# Patient Record
Sex: Male | Born: 1960 | Race: Black or African American | Hispanic: No | Marital: Married | Smoking: Never smoker
Health system: Southern US, Community
[De-identification: ages and names within clinical notes are randomized; demographics above are authoritative.]

## PROBLEM LIST (undated history)

## (undated) DIAGNOSIS — J45909 Unspecified asthma, uncomplicated: Secondary | ICD-10-CM

## (undated) DIAGNOSIS — E785 Hyperlipidemia, unspecified: Secondary | ICD-10-CM

## (undated) DIAGNOSIS — F329 Major depressive disorder, single episode, unspecified: Secondary | ICD-10-CM

## (undated) DIAGNOSIS — I10 Essential (primary) hypertension: Secondary | ICD-10-CM

## (undated) DIAGNOSIS — M109 Gout, unspecified: Secondary | ICD-10-CM

## (undated) DIAGNOSIS — I82409 Acute embolism and thrombosis of unspecified deep veins of unspecified lower extremity: Secondary | ICD-10-CM

## (undated) DIAGNOSIS — G4730 Sleep apnea, unspecified: Secondary | ICD-10-CM

## (undated) DIAGNOSIS — F32A Depression, unspecified: Secondary | ICD-10-CM

## (undated) DIAGNOSIS — C009 Malignant neoplasm of lip, unspecified: Secondary | ICD-10-CM

## (undated) DIAGNOSIS — R319 Hematuria, unspecified: Secondary | ICD-10-CM

## (undated) DIAGNOSIS — F419 Anxiety disorder, unspecified: Secondary | ICD-10-CM

## (undated) DIAGNOSIS — I872 Venous insufficiency (chronic) (peripheral): Secondary | ICD-10-CM

## (undated) HISTORY — PX: OTHER SURGICAL HISTORY: SHX169

## (undated) HISTORY — DX: Depression, unspecified: F32.A

## (undated) HISTORY — PX: EYE SURGERY: SHX253

## (undated) HISTORY — DX: Hyperlipidemia, unspecified: E78.5

## (undated) HISTORY — DX: Acute embolism and thrombosis of unspecified deep veins of unspecified lower extremity: I82.409

## (undated) HISTORY — DX: Malignant neoplasm of lip, unspecified: C00.9

## (undated) HISTORY — DX: Anxiety disorder, unspecified: F41.9

## (undated) HISTORY — DX: Sleep apnea, unspecified: G47.30

## (undated) HISTORY — DX: Major depressive disorder, single episode, unspecified: F32.9

---

## 2004-06-08 ENCOUNTER — Emergency Department: Payer: Self-pay | Admitting: General Practice

## 2004-06-09 ENCOUNTER — Ambulatory Visit: Payer: Self-pay | Admitting: Internal Medicine

## 2005-06-26 ENCOUNTER — Emergency Department: Payer: Self-pay | Admitting: General Practice

## 2005-07-08 ENCOUNTER — Emergency Department: Payer: Self-pay | Admitting: Emergency Medicine

## 2005-07-14 ENCOUNTER — Emergency Department: Payer: Self-pay | Admitting: Emergency Medicine

## 2005-07-18 ENCOUNTER — Emergency Department: Payer: Self-pay | Admitting: Unknown Physician Specialty

## 2005-07-28 ENCOUNTER — Emergency Department: Payer: Self-pay | Admitting: Emergency Medicine

## 2005-08-11 ENCOUNTER — Emergency Department: Payer: Self-pay | Admitting: Emergency Medicine

## 2005-08-18 ENCOUNTER — Emergency Department: Payer: Self-pay | Admitting: Emergency Medicine

## 2005-09-16 ENCOUNTER — Emergency Department: Payer: Self-pay | Admitting: Emergency Medicine

## 2005-09-29 ENCOUNTER — Emergency Department: Payer: Self-pay | Admitting: Unknown Physician Specialty

## 2005-11-13 ENCOUNTER — Emergency Department: Payer: Self-pay | Admitting: General Practice

## 2005-12-16 ENCOUNTER — Emergency Department: Payer: Self-pay | Admitting: Internal Medicine

## 2006-01-05 ENCOUNTER — Emergency Department: Payer: Self-pay | Admitting: General Practice

## 2006-02-01 ENCOUNTER — Emergency Department: Payer: Self-pay | Admitting: Unknown Physician Specialty

## 2006-02-07 ENCOUNTER — Emergency Department: Payer: Self-pay | Admitting: Unknown Physician Specialty

## 2006-02-16 ENCOUNTER — Emergency Department: Payer: Self-pay | Admitting: Unknown Physician Specialty

## 2006-02-28 ENCOUNTER — Emergency Department: Payer: Self-pay | Admitting: Emergency Medicine

## 2006-03-03 ENCOUNTER — Emergency Department: Payer: Self-pay | Admitting: Emergency Medicine

## 2006-03-07 ENCOUNTER — Emergency Department: Payer: Self-pay | Admitting: Emergency Medicine

## 2006-03-15 ENCOUNTER — Emergency Department: Payer: Self-pay | Admitting: Unknown Physician Specialty

## 2006-03-22 ENCOUNTER — Emergency Department: Payer: Self-pay | Admitting: Unknown Physician Specialty

## 2006-03-30 ENCOUNTER — Emergency Department: Payer: Self-pay | Admitting: Emergency Medicine

## 2006-04-06 ENCOUNTER — Emergency Department: Payer: Self-pay | Admitting: Internal Medicine

## 2006-04-14 ENCOUNTER — Emergency Department: Payer: Self-pay | Admitting: Unknown Physician Specialty

## 2006-04-22 ENCOUNTER — Emergency Department: Payer: Self-pay | Admitting: Emergency Medicine

## 2006-08-27 ENCOUNTER — Emergency Department: Payer: Self-pay | Admitting: Emergency Medicine

## 2006-09-29 ENCOUNTER — Emergency Department: Payer: Self-pay | Admitting: Emergency Medicine

## 2007-10-19 ENCOUNTER — Ambulatory Visit: Payer: Self-pay | Admitting: *Deleted

## 2007-10-31 ENCOUNTER — Encounter: Admission: RE | Admit: 2007-10-31 | Discharge: 2008-01-29 | Payer: Self-pay

## 2007-12-13 ENCOUNTER — Ambulatory Visit: Payer: Self-pay | Admitting: Internal Medicine

## 2007-12-13 ENCOUNTER — Encounter: Payer: Self-pay | Admitting: Family Medicine

## 2007-12-13 LAB — CONVERTED CEMR LAB
AST: 17 units/L (ref 0–37)
Alkaline Phosphatase: 71 units/L (ref 39–117)
BUN: 11 mg/dL (ref 6–23)
Basophils Relative: 1 % (ref 0–1)
Creatinine, Ser: 0.76 mg/dL (ref 0.40–1.50)
Eosinophils Absolute: 0.3 10*3/uL (ref 0.0–0.7)
Eosinophils Relative: 3 % (ref 0–5)
HCT: 44.2 % (ref 39.0–52.0)
HDL: 53 mg/dL (ref >39)
Hemoglobin: 14.8 g/dL (ref 13.0–17.0)
LDL Cholesterol: 88 mg/dL (ref 0–99)
MCHC: 33.5 g/dL (ref 30.0–36.0)
MCV: 91.1 fL (ref 78.0–100.0)
Monocytes Absolute: 0.5 10*3/uL (ref 0.1–1.0)
Monocytes Relative: 5 % (ref 3–12)
Neutrophils Relative %: 58 % (ref 43–77)
RBC: 4.85 M/uL (ref 4.22–5.81)
TSH: 4.991 microintl units/mL (ref 0.350–5.50)
Total Bilirubin: 0.4 mg/dL (ref 0.3–1.2)
Total CHOL/HDL Ratio: 3
Triglycerides: 94 mg/dL (ref <150)

## 2007-12-14 ENCOUNTER — Encounter: Payer: Self-pay | Admitting: Family Medicine

## 2008-01-12 ENCOUNTER — Ambulatory Visit: Payer: Self-pay | Admitting: Internal Medicine

## 2008-01-19 ENCOUNTER — Encounter: Admission: RE | Admit: 2008-01-19 | Discharge: 2008-01-19 | Payer: Self-pay

## 2008-02-13 ENCOUNTER — Encounter: Admission: RE | Admit: 2008-02-13 | Discharge: 2008-03-06 | Payer: Self-pay | Admitting: Family Medicine

## 2008-02-15 ENCOUNTER — Ambulatory Visit: Payer: Self-pay | Admitting: Internal Medicine

## 2008-02-15 ENCOUNTER — Encounter: Payer: Self-pay | Admitting: Family Medicine

## 2008-02-15 LAB — CONVERTED CEMR LAB
CO2: 23 meq/L (ref 19–32)
Calcium: 9.1 mg/dL (ref 8.4–10.5)
Chloride: 102 meq/L (ref 96–112)
Creatinine, Ser: 0.75 mg/dL (ref 0.40–1.50)
Glucose, Bld: 83 mg/dL (ref 70–99)
Total Bilirubin: 0.6 mg/dL (ref 0.3–1.2)
Total Protein: 7.8 g/dL (ref 6.0–8.3)

## 2008-02-16 ENCOUNTER — Encounter: Admission: RE | Admit: 2008-02-16 | Discharge: 2008-03-18 | Payer: Self-pay | Admitting: Student

## 2008-03-15 ENCOUNTER — Ambulatory Visit: Payer: Self-pay | Admitting: Internal Medicine

## 2008-03-21 ENCOUNTER — Encounter: Admission: RE | Admit: 2008-03-21 | Discharge: 2008-05-28 | Payer: Self-pay | Admitting: Student

## 2008-07-01 ENCOUNTER — Encounter: Admission: RE | Admit: 2008-07-01 | Discharge: 2008-07-16 | Payer: Self-pay | Admitting: Student

## 2008-07-03 ENCOUNTER — Ambulatory Visit: Payer: Self-pay | Admitting: Internal Medicine

## 2008-07-10 ENCOUNTER — Ambulatory Visit: Payer: Self-pay | Admitting: Internal Medicine

## 2008-07-22 ENCOUNTER — Ambulatory Visit: Payer: Self-pay | Admitting: Internal Medicine

## 2008-08-05 ENCOUNTER — Encounter: Admission: RE | Admit: 2008-08-05 | Discharge: 2008-11-03 | Payer: Self-pay | Admitting: Student

## 2008-08-06 ENCOUNTER — Ambulatory Visit: Payer: Self-pay | Admitting: Internal Medicine

## 2008-08-28 ENCOUNTER — Ambulatory Visit: Payer: Self-pay | Admitting: Internal Medicine

## 2008-09-18 ENCOUNTER — Ambulatory Visit: Payer: Self-pay | Admitting: Internal Medicine

## 2008-10-02 ENCOUNTER — Ambulatory Visit: Payer: Self-pay | Admitting: Internal Medicine

## 2008-11-20 ENCOUNTER — Encounter: Admission: RE | Admit: 2008-11-20 | Discharge: 2008-11-20 | Payer: Self-pay | Admitting: Student

## 2008-12-23 ENCOUNTER — Encounter: Admission: RE | Admit: 2008-12-23 | Discharge: 2009-03-23 | Payer: Self-pay | Admitting: Student

## 2009-01-01 ENCOUNTER — Ambulatory Visit: Payer: Self-pay | Admitting: Internal Medicine

## 2009-01-08 ENCOUNTER — Encounter: Payer: Self-pay | Admitting: Internal Medicine

## 2009-01-08 ENCOUNTER — Ambulatory Visit (HOSPITAL_COMMUNITY): Admission: RE | Admit: 2009-01-08 | Discharge: 2009-01-08 | Payer: Self-pay | Admitting: Internal Medicine

## 2009-01-08 ENCOUNTER — Ambulatory Visit: Payer: Self-pay | Admitting: Vascular Surgery

## 2009-06-25 ENCOUNTER — Ambulatory Visit: Payer: Self-pay | Admitting: Family Medicine

## 2009-07-15 ENCOUNTER — Encounter: Payer: Self-pay | Admitting: Family Medicine

## 2009-07-18 ENCOUNTER — Ambulatory Visit: Payer: Self-pay | Admitting: Family Medicine

## 2009-08-06 ENCOUNTER — Ambulatory Visit: Payer: Self-pay | Admitting: Family Medicine

## 2009-09-26 ENCOUNTER — Ambulatory Visit: Payer: Self-pay | Admitting: Family Medicine

## 2009-10-07 ENCOUNTER — Ambulatory Visit: Payer: Self-pay | Admitting: Family Medicine

## 2009-11-04 ENCOUNTER — Ambulatory Visit: Payer: Self-pay | Admitting: Family Medicine

## 2009-12-05 ENCOUNTER — Ambulatory Visit: Payer: Self-pay | Admitting: Family Medicine

## 2010-01-04 ENCOUNTER — Ambulatory Visit: Payer: Self-pay | Admitting: Family Medicine

## 2010-02-13 ENCOUNTER — Ambulatory Visit: Payer: Self-pay | Admitting: Family Medicine

## 2010-03-17 ENCOUNTER — Ambulatory Visit: Payer: Self-pay | Admitting: Family Medicine

## 2010-04-02 ENCOUNTER — Ambulatory Visit: Payer: Self-pay | Admitting: Family Medicine

## 2010-04-06 ENCOUNTER — Ambulatory Visit: Payer: Self-pay | Admitting: Family Medicine

## 2010-05-08 ENCOUNTER — Ambulatory Visit: Payer: Self-pay | Admitting: Family Medicine

## 2010-06-06 ENCOUNTER — Ambulatory Visit: Payer: Self-pay | Admitting: Family Medicine

## 2011-07-22 ENCOUNTER — Ambulatory Visit: Payer: Self-pay | Admitting: Family Medicine

## 2011-08-07 ENCOUNTER — Ambulatory Visit: Payer: Self-pay | Admitting: Family Medicine

## 2011-11-25 ENCOUNTER — Ambulatory Visit: Payer: Self-pay | Admitting: Family Medicine

## 2011-12-06 ENCOUNTER — Ambulatory Visit: Payer: Self-pay | Admitting: Family Medicine

## 2012-01-06 ENCOUNTER — Ambulatory Visit: Payer: Self-pay | Admitting: Family Medicine

## 2012-02-05 ENCOUNTER — Ambulatory Visit: Payer: Self-pay | Admitting: Family Medicine

## 2012-03-06 ENCOUNTER — Ambulatory Visit: Payer: Self-pay | Admitting: Family Medicine

## 2012-04-20 ENCOUNTER — Ambulatory Visit: Payer: Self-pay | Admitting: Family Medicine

## 2012-05-07 ENCOUNTER — Ambulatory Visit: Payer: Self-pay | Admitting: Family Medicine

## 2014-04-08 ENCOUNTER — Ambulatory Visit: Payer: Self-pay | Admitting: Family Medicine

## 2014-05-07 ENCOUNTER — Ambulatory Visit: Payer: Self-pay | Admitting: Family Medicine

## 2014-06-10 ENCOUNTER — Ambulatory Visit: Payer: Self-pay | Admitting: Family Medicine

## 2014-07-07 ENCOUNTER — Ambulatory Visit: Payer: Self-pay | Admitting: Family Medicine

## 2014-08-20 ENCOUNTER — Ambulatory Visit: Payer: Self-pay | Admitting: Family Medicine

## 2014-09-06 ENCOUNTER — Ambulatory Visit: Payer: Self-pay | Admitting: Family Medicine

## 2014-10-08 ENCOUNTER — Ambulatory Visit: Payer: Self-pay | Admitting: Family Medicine

## 2015-11-07 ENCOUNTER — Other Ambulatory Visit: Payer: Self-pay | Admitting: General Surgery

## 2015-11-25 ENCOUNTER — Ambulatory Visit (HOSPITAL_COMMUNITY)
Admission: RE | Admit: 2015-11-25 | Discharge: 2015-11-25 | Disposition: A | Discharge status: Home / Self Care | Payer: Medicare Other | Source: Ambulatory Visit | Attending: General Surgery | Admitting: General Surgery

## 2015-11-25 ENCOUNTER — Other Ambulatory Visit: Payer: Self-pay | Admitting: General Surgery

## 2015-11-25 DIAGNOSIS — I517 Cardiomegaly: Secondary | ICD-10-CM | POA: Insufficient documentation

## 2015-11-25 DIAGNOSIS — J810 Acute pulmonary edema: Secondary | ICD-10-CM | POA: Diagnosis not present

## 2015-12-09 ENCOUNTER — Ambulatory Visit: Payer: Self-pay | Admitting: Dietician

## 2015-12-16 ENCOUNTER — Encounter: Payer: Self-pay | Admitting: Dietician

## 2015-12-16 ENCOUNTER — Encounter: Payer: Medicare Other | Attending: General Surgery | Admitting: Dietician

## 2015-12-16 NOTE — Progress Notes (Signed)
  Pre-Op Assessment Visit:  Pre-Operative Sleeve Gastrectomy Surgery  Medical Nutrition Therapy:  Appt start time: [id number]   End time:  1220.  Patient was seen on 12/16/2015 for Pre-Operative Nutrition Assessment. Assessment and letter of approval faxed to [facility] Bariatric Surgery Program coordinator on 12/16/2015.   Preferred Learning Style:   No preference indicated   Learning Readiness:   Ready  Handouts given during visit include:  Pre-Op Goals Bariatric Surgery Protein Shakes   During the appointment today the following Pre-Op Goals were reviewed with the patient: Maintain or lose weight as instructed by your surgeon Make healthy food choices Begin to limit portion sizes Limited concentrated sugars and fried foods Keep fat/sugar in the single digits per serving on   food labels Practice CHEWING your food  (aim for 30 chews per bite or until applesauce consistency) Practice not drinking 15 minutes before, during, and 30 minutes after each meal/snack Avoid all carbonated beverages  Avoid/limit caffeinated beverages  Avoid all sugar-sweetened beverages Consume 3 meals per day; eat every 3-5 hours Make a list of non-food related activities Aim for 64-100 ounces of FLUID daily  Aim for at least 60-80 grams of PROTEIN daily Look for a liquid protein source that contain ?15 g protein and ?5 g carbohydrate  (ex: shakes, drinks, shots)  Patient-Centered Goals: Play basketball, walk more  Demonstrated degree of understanding via:  Teach Back  Teaching Method Utilized:  Visual Auditory Hands on  Barriers to learning/adherence to lifestyle change: intellectually disabled but high functioning  [facility] to [facility] to enroll in Pre-Op and Post-Op Nutrition Education when surgery date is scheduled.

## 2015-12-16 NOTE — Patient Instructions (Signed)
Follow Pre-Op Goals Try Protein Shakes Call [facility] at [phone] when surgery is scheduled to enroll in Pre-Op Class  Things to remember:  Please always be honest with us. We want to support you!  If you have any questions or concerns in between appointments, please call or email Liz, Leslie, or Laurie.  The diet after surgery will be high protein and low in carbohydrate.  Vitamins and calcium need to be taken for the rest of your life.  Feel free to include support people in any classes or appointments.   Supplement recommendations:  Complete" Multivitamin: Sleeve Gastrectomy and RYGB patients take a double dose of MVI. LAGB patients take single dose as it is written on the package. Vitamin must be liquid or chewable but not gummy. Examples of these include Flintstones Complete and Centrum Complete. If the vitamin is bariatric-specific, take 1 dose as it is already formulated for bariatric surgery patients. Examples of these are Bariatric Advantage, Celebrate, and Wellesse. These can be found at the [facility] and/or online.     Calcium citrate: 1500 mg/day of Calcium citrate (also chewable or liquid) is recommended for all procedures. The body is only able to absorb 500-600 mg of Calcium at one time so 3 daily doses of 500 mg are recommended. Calcium doses must be taken a minimum of 2 hours apart. Additionally, Calcium must be taken 2 hours apart from iron-containing MVI. Examples of brands include Celebrate, Bariatric Advantage, and Wellesse. These brands must be purchased online or at the  Outpatient Pharmacy. Citracal Petites is the only Calcium citrate supplement found in general grocery stores and pharmacies. This is in tablet form and may be recommended for patients who do not tolerate chewable Calcium.  Continued or added Vitamin D supplementation based on individual needs.    Vitamin B12: 300-500 mcg/day for Sleeve Gastrectomy and RYGB. Optional for  LAGB patients as stomach remains fully intact. Must be taken intramuscularly, sublingually, or inhaled nasally. Oral route is not recommended. 

## 2016-01-27 ENCOUNTER — Encounter: Payer: Self-pay | Admitting: Dietician

## 2016-01-27 ENCOUNTER — Encounter: Payer: Medicare Other | Attending: General Surgery | Admitting: Dietician

## 2016-01-27 NOTE — Progress Notes (Signed)
6 Months Supervised Weight Loss Visit:   Pre-Operative sleeve gastrectomy Surgery  Medical Nutrition Therapy:  Appt start time: [id number] end time:  1130.  Primary concerns today: Supervised Weight Loss Visit. At appointment with wife and caregiver. Returns with a 3 lb weight loss since last time. Has been cutting back soda. Drinking water with flavoring and 2% milk. Having 3 meals per day. Having some sweets or fried foods (not often). Walks 3 x week for 15-20 minutes and swims 2 x week for about 60 minutes. Has tried [employer] and and Aktins. Still needs to work on chewing well. Waiting to drink until finished meal.   Works Wednesday and Friday. Feels like he has more energy and can tell he lost weight.   Weight: 479.1 lbs BMI: 66.8  Preferred Learning Style:   Auditory  No preference indicated   Learning Readiness:   Ready  Progress Towards Goal(s):  In progress.  Handouts given during visit include:  High protein snacks   Nutritional Diagnosis:  [place]-3.3 Obesity related to past poor dietary habits and physical inactivity as evidenced by patient attending supervised weight loss for insurance approval of bariatric surgery.    Intervention:  Nutrition counseling provided. Plan: Make sure the Slim Fast has 5 grams of carbohydrates or less per serving and 15 grams of protein or more per serving. (Low carb version). Work on cutting back on portion sizes.  Cut out soda completely. Work on chewing well and taking 20 minutes to eat. Start working on waiting 30 minutes to drink after eating.  Try 1% milk.   Teaching Method Utilized:  Visual Auditory Hands on  Barriers to learning/adherence to lifestyle change: slow learner  Demonstrated degree of understanding via:  Teach Back   Monitoring/Evaluation:  Dietary intake, exercise, and body weight. Follow up in 1 months for 6 month supervised weight loss visit.

## 2016-01-27 NOTE — Patient Instructions (Addendum)
Make sure the Slim Fast has 5 grams of carbohydrates or less per serving and 15 grams of protein or more per serving. (Low carb version). Work on cutting back on portion sizes.  Cut out soda completely. Work on chewing well and taking 20 minutes to eat. Start working on waiting 30 minutes to drink after eating.  Try 1% milk.

## 2016-02-24 ENCOUNTER — Encounter: Payer: Self-pay | Admitting: Dietician

## 2016-02-24 ENCOUNTER — Encounter: Payer: Medicare Other | Attending: General Surgery | Admitting: Dietician

## 2016-02-24 VITALS — Ht 71.0 in | Wt >= 6400 oz

## 2016-02-24 DIAGNOSIS — Z713 Dietary counseling and surveillance: Secondary | ICD-10-CM | POA: Diagnosis not present

## 2016-02-24 DIAGNOSIS — E119 Type 2 diabetes mellitus without complications: Secondary | ICD-10-CM | POA: Diagnosis not present

## 2016-02-24 NOTE — Patient Instructions (Signed)
Make sure the Slim Fast has 5 grams of carbohydrates or less per serving and 15 grams of protein or more per serving. (Low carb version). Work on cutting back on portion sizes.  Cut out soda completely. Work on chewing well and taking 20 minutes to eat. Start working on waiting 30 minutes to drink after eating.  Try 1% milk.   -Try to start getting into the mindset of not having any bread after surgery -Boil some eggs to have for snacks

## 2016-02-24 NOTE — Progress Notes (Signed)
6 Months Supervised Weight Loss Visit:   Pre-Operative sleeve gastrectomy Surgery  Medical Nutrition Therapy:  Appt start time: [id number] end time:  1130.  Primary concerns today: Supervised Weight Loss Visit. Eric Berg returns today with his wife and caregiver having lost another 6.3 lbs. He states that things have been going well. Currently has a UTI and is taking antibiotics. 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 He had been walking and going to the [facility] previously (swimming laps). Has been drinking the Slim Fast low carb. Still working on breaking soda habit.  Weight: 472.7 lbs BMI: 66.1  Preferred Learning Style:   Auditory  No preference indicated   Learning Readiness:   Ready  Progress Towards Goal(s):  In progress.  24-hour recall: B: Slim Fast low carb shake L: salami and cheese sandwich D: 2 pork chops S: sugar free pudding or Fiber One bar or yogurt    Handouts given during visit include:  High protein snacks   Nutritional Diagnosis:  [place]-3.3 Obesity related to past poor dietary habits and physical inactivity as evidenced by patient attending supervised weight loss for insurance approval of bariatric surgery.    Intervention:  Nutrition counseling provided. Plan: Make sure the Slim Fast has 5 grams of carbohydrates or less per serving and 15 grams of protein or more per serving. (Low carb version). Work on cutting back on portion sizes.  Cut out soda completely. Work on chewing well and taking 20 minutes to eat. Start working on waiting 30 minutes to drink after eating.  Try 1% milk.  -Try to start getting into the mindset of not having any bread after surgery -Boil some eggs to have for snacks  Teaching Method Utilized:  Visual Auditory Hands on  Barriers to learning/adherence to lifestyle change: suspected learning deficit  Demonstrated degree of understanding via:  Teach Back    Monitoring/Evaluation:  Dietary intake, exercise, and body weight. Follow up in 1 months for 6 month supervised weight loss visit.

## 2016-03-23 ENCOUNTER — Encounter: Payer: Medicare Other | Attending: General Surgery | Admitting: Dietician

## 2016-03-23 ENCOUNTER — Encounter: Payer: Self-pay | Admitting: Dietician

## 2016-03-23 DIAGNOSIS — E119 Type 2 diabetes mellitus without complications: Secondary | ICD-10-CM | POA: Diagnosis not present

## 2016-03-23 DIAGNOSIS — Z713 Dietary counseling and surveillance: Secondary | ICD-10-CM | POA: Insufficient documentation

## 2016-03-23 NOTE — Progress Notes (Signed)
6 Months Supervised Weight Loss Visit:   Pre-Operative sleeve gastrectomy Surgery  Medical Nutrition Therapy:  Appt start time: 1120 end time:  [id number].  Primary concerns today: Supervised Weight Loss Visit. Eric Berg returns today with his wife and caregiver having lost another 6.3 lbs. He states that things have been going well. Currently has a UTI and is taking antibiotics. 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 He had been walking and going to the [facility] previously (swimming laps). Has been drinking the Slim Fast low carb. Still working on breaking soda habit.  Has lost another 3.5 pounds. Still struggling to reduce soda. He is not drinking soda every day, may have soda at work. He drinks milk, water, and sugar free, non carbonated ICE water. Weighed 502 lbs in February 2017. He states that his surgeon would like him to lose 50 pounds prior to surgery. Continuing to try to stay active with walking and swimming.   Goal: 441 lbs  Weight: 469.2 lbs BMI: 65.6   Preferred Learning Style:   Auditory  No preference indicated   Learning Readiness:   Ready  Progress Towards Goal(s):  In progress.  24-hour recall: B: Slim Fast low carb shake L: salami and cheese sandwich D: 2 pork chops S: sugar free pudding or Fiber One bar or yogurt    Handouts given during visit include:  High protein snacks   Nutritional Diagnosis:  [place]-3.3 Obesity related to past poor dietary habits and physical inactivity as evidenced by patient attending supervised weight loss for insurance approval of bariatric surgery.    Intervention:  Nutrition counseling provided. Plan: Make sure the Slim Fast has 5 grams of carbohydrates or less per serving and 15 grams of protein or more per serving. (Low carb version). Work on cutting back on portion sizes.  Cut out soda completely. Work on chewing well and taking 20 minutes to eat. Start working on  waiting 30 minutes to drink after eating.  Try 1% milk.  -Try to start getting into the mindset of not having any bread after surgery -Boil some eggs to have for snacks -Bring an ICE water to work to have instead of soda -Try Oikos Triple Zero or Dannon Light and Fit Greek yogurt  -Try freezing them in a popsicle mold -Protein shakes:  -Eas AdvantEdge  -Atkins  -Premier  Teaching Method Utilized:  Visual Auditory Hands on  Barriers to learning/adherence to lifestyle change: suspected learning deficit  Demonstrated degree of understanding via:  Teach Back   Monitoring/Evaluation:  Dietary intake, exercise, and body weight. Follow up in 1 months for 6 month supervised weight loss visit.

## 2016-03-23 NOTE — Patient Instructions (Addendum)
Make sure the Slim Fast has 5 grams of carbohydrates or less per serving and 15 grams of protein or more per serving. (Low carb version). Work on cutting back on portion sizes.  Cut out soda completely. Work on chewing well and taking 20 minutes to eat. Start working on waiting 30 minutes to drink after eating.  Try 1% milk.   -Try to start getting into the mindset of not having any bread after surgery  -Boil some eggs to have for snacks  -Bring an ICE water to work to have instead of soda  -Try Oikos Triple Zero or Dannon Light and Fit Greek yogurt  -Try freezing them in a popsicle mold  -Protein shakes:  -Eric Berg

## 2016-04-06 DIAGNOSIS — I82409 Acute embolism and thrombosis of unspecified deep veins of unspecified lower extremity: Secondary | ICD-10-CM

## 2016-04-06 HISTORY — DX: Acute embolism and thrombosis of unspecified deep veins of unspecified lower extremity: I82.409

## 2016-04-27 ENCOUNTER — Encounter: Payer: Medicare Other | Attending: General Surgery | Admitting: Dietician

## 2016-04-27 ENCOUNTER — Encounter: Payer: Self-pay | Admitting: Dietician

## 2016-04-27 DIAGNOSIS — E119 Type 2 diabetes mellitus without complications: Secondary | ICD-10-CM | POA: Diagnosis not present

## 2016-04-27 DIAGNOSIS — Z713 Dietary counseling and surveillance: Secondary | ICD-10-CM | POA: Insufficient documentation

## 2016-04-27 NOTE — Progress Notes (Signed)
6 Months Supervised Weight Loss Visit:   Pre-Operative sleeve gastrectomy Surgery  Medical Nutrition Therapy:  Appt start time: 1125 end time:  [id number].  Primary concerns today: Supervised Weight Loss Visit. Eric Berg returns today with his caregiver having maintained his weight. His caregiver states his sodium intake has been higher lately and he has significant swelling in his right leg. Otherwise, he can tell he's lost weight because he is able to be more mobile (able to do housework, able to get off couch easier, able to use seatbelt more comfortably and without an extension). Has continued to limit bread intake. Has been sticking to plain water at work instead of soda. However, he does sometimes still drink soda. Tried Atkins protein shake and likes it. He tried 1% milk and does not like it.  Would like to try a shaker bottle and is interested in updated fast food guide when available.   Samples provided and patient instructed on proper use: Unjury protein powder (chocolate - qty 2) Lot#: [id number] Exp: 06/2017  Goal: 441 lbs  Weight: 469.2 lbs BMI: 65.6   Preferred Learning Style:   Auditory  No preference indicated   Learning Readiness:   Ready  Progress Towards Goal(s):  In progress.  24-hour recall: B: Slim Fast low carb shake L: salami and cheese sandwich D: 2 pork chops S: sugar free pudding or Fiber One bar or yogurt    Handouts given during visit include:  none   Nutritional Diagnosis:  [name]-3.3 Obesity related to past poor dietary habits and physical inactivity as evidenced by patient attending supervised weight loss for insurance approval of bariatric surgery.    Intervention:  Nutrition counseling provided. -Continue to drink water at work instead of soda -Ask for the lower sodium deli meat  -Have 2 slices of [place] and 2 slices of cheese -Practice getting into the habit of chewing each bite 20-30 times -Try mixing chocolate protein powder in milk (think of  this as a meal or snack)  Teaching Method Utilized:  Visual Auditory Hands on  Barriers to learning/adherence to lifestyle change: suspected learning deficit  Demonstrated degree of understanding via:  Teach Back   Monitoring/Evaluation:  Dietary intake, exercise, and body weight. Follow up in 1 months for 6 month supervised weight loss visit.

## 2016-04-27 NOTE — Patient Instructions (Addendum)
Make sure the Slim Fast has 5 grams of carbohydrates or less per serving and 15 grams of protein or more per serving. (Low carb version). Work on cutting back on portion sizes.  Cut out soda completely. Work on chewing well and taking 20 minutes to eat. Start working on waiting 30 minutes to drink after eating.  Try 1% milk.   -Continue to drink water at work instead of soda -Ask for the lower sodium deli meat  -Have 2 slices of [place] and 2 slices of cheese -Practice getting into the habit of chewing each bite 20-30 times -Try mixing chocolate protein powder in milk (think of this as a meal or snack)

## 2016-04-30 ENCOUNTER — Other Ambulatory Visit: Payer: Self-pay | Admitting: Family Medicine

## 2016-04-30 ENCOUNTER — Emergency Department
Admission: EM | Admit: 2016-04-30 | Discharge: 2016-04-30 | Disposition: A | Discharge status: Home / Self Care | Payer: Medicare Other | Attending: Emergency Medicine | Admitting: Emergency Medicine

## 2016-04-30 ENCOUNTER — Ambulatory Visit
Admission: RE | Admit: 2016-04-30 | Discharge: 2016-04-30 | Disposition: A | Discharge status: Home / Self Care | Payer: Medicare Other | Source: Ambulatory Visit | Attending: Family Medicine | Admitting: Family Medicine

## 2016-04-30 DIAGNOSIS — I82431 Acute embolism and thrombosis of right popliteal vein: Secondary | ICD-10-CM | POA: Insufficient documentation

## 2016-04-30 DIAGNOSIS — J45909 Unspecified asthma, uncomplicated: Secondary | ICD-10-CM | POA: Diagnosis not present

## 2016-04-30 DIAGNOSIS — Z791 Long term (current) use of non-steroidal anti-inflammatories (NSAID): Secondary | ICD-10-CM | POA: Insufficient documentation

## 2016-04-30 DIAGNOSIS — O2230 Deep phlebothrombosis in pregnancy, unspecified trimester: Secondary | ICD-10-CM

## 2016-04-30 DIAGNOSIS — Z7984 Long term (current) use of oral hypoglycemic drugs: Secondary | ICD-10-CM | POA: Insufficient documentation

## 2016-04-30 DIAGNOSIS — M7989 Other specified soft tissue disorders: Secondary | ICD-10-CM | POA: Diagnosis present

## 2016-04-30 DIAGNOSIS — I10 Essential (primary) hypertension: Secondary | ICD-10-CM | POA: Insufficient documentation

## 2016-04-30 DIAGNOSIS — M79661 Pain in right lower leg: Secondary | ICD-10-CM

## 2016-04-30 DIAGNOSIS — E119 Type 2 diabetes mellitus without complications: Secondary | ICD-10-CM | POA: Diagnosis not present

## 2016-04-30 HISTORY — DX: Unspecified asthma, uncomplicated: J45.909

## 2016-04-30 HISTORY — DX: Hematuria, unspecified: R31.9

## 2016-04-30 HISTORY — DX: Venous insufficiency (chronic) (peripheral): I87.2

## 2016-04-30 HISTORY — DX: Essential (primary) hypertension: I10

## 2016-04-30 HISTORY — DX: Gout, unspecified: M10.9

## 2016-04-30 HISTORY — DX: Morbid (severe) obesity due to excess calories: E66.01

## 2016-04-30 LAB — BASIC METABOLIC PANEL
ANION GAP: 9 (ref 5–15)
BUN: 15 mg/dL (ref 6–20)
CO2: 26 mmol/L (ref 22–32)
Calcium: 8.9 mg/dL (ref 8.9–10.3)
Chloride: 102 mmol/L (ref 101–111)
Creatinine, Ser: 0.71 mg/dL (ref 0.61–1.24)
Glucose, Bld: 98 mg/dL (ref 65–99)
POTASSIUM: 4.4 mmol/L (ref 3.5–5.1)
SODIUM: 137 mmol/L (ref 135–145)

## 2016-04-30 LAB — CBC
HEMATOCRIT: 41.1 % (ref 40.0–52.0)
HEMOGLOBIN: 14.4 g/dL (ref 13.0–18.0)
MCH: 32.3 pg (ref 26.0–34.0)
MCHC: 35.1 g/dL (ref 32.0–36.0)
MCV: 91.9 fL (ref 80.0–100.0)
Platelets: 284 10*3/uL (ref 150–440)
RBC: 4.47 MIL/uL (ref 4.40–5.90)
RDW: 13.7 % (ref 11.5–14.5)
WBC: 9.3 10*3/uL (ref 3.8–10.6)

## 2016-04-30 LAB — APTT: APTT: 33 s (ref 24–36)

## 2016-04-30 LAB — PROTIME-INR
INR: 0.98
Prothrombin Time: 13 seconds (ref 11.4–15.2)

## 2016-04-30 MED ORDER — RIVAROXABAN (XARELTO) VTE STARTER PACK (15 & 20 MG): ORAL_TABLET | ORAL | 0 refills | Status: DC

## 2016-04-30 MED ORDER — RIVAROXABAN 15 MG PO TABS
15.0000 mg | ORAL_TABLET | Freq: Once | ORAL | Status: AC
  Administered 2016-04-30: 15 mg via ORAL
  Filled 2016-04-30: qty 1

## 2016-04-30 NOTE — ED Provider Notes (Addendum)
[facility] Emergency Department Provider Note  ____________________________________________   I have reviewed the triage vital signs and the nursing notes.   HISTORY  Chief Complaint Leg Pain (right )    HPI Eric Berg is a 55 y.o. male  with no history of rectal bleeding or blood clots presents today with unilateral right-sided lower shortly swelling which began a few days ago. Monday. Today is Friday. States that he has had no chest pain or shortness of breath. He saw his doctor had an outpatient ultrasound shows a nonocclusive DVT. He is here for further care.   Past Medical History:  Diagnosis Date  . Asthma   . Diabetes mellitus without complication ([place])   . Gout   . Hematuria   . Hypertension   . Morbid (severe) obesity due to excess calories ([place])   . Stasis dermatitis     There are no active problems to display for this patient.   Past Surgical History:  Procedure Laterality Date  . EYE SURGERY      Prior to Admission medications   Medication Sig Start Date End Date Taking? Authorizing Provider  atenolol (TENORMIN) 100 MG tablet Take 100 mg by mouth daily.    Historical Provider, MD  buPROPion (WELLBUTRIN) 100 MG tablet Take 100 mg by mouth 2 (two) times daily.    Historical Provider, MD  desvenlafaxine (PRISTIQ) 100 MG 24 hr tablet Take 100 mg by mouth daily.    Historical Provider, MD  haloperidol (HALDOL) 2 MG tablet Take 2 mg by mouth 2 (two) times daily.    Historical Provider, MD  lisdexamfetamine (VYVANSE) 40 MG capsule Take 50 mg by mouth every morning.     Historical Provider, MD  metFORMIN (GLUCOPHAGE) 500 MG tablet Take by mouth 2 (two) times daily with a meal.    Historical Provider, MD  quinapril (ACCUPRIL) 20 MG tablet Take 20 mg by mouth at bedtime.    Historical Provider, MD  traZODone (DESYREL) 150 MG tablet Take by mouth at bedtime.    Historical Provider, MD    Allergies Review of patient's allergies  indicates no known allergies.  No family history on file.  Social History Social History  Substance Use Topics  . Smoking status: Never Smoker  . Smokeless tobacco: Never Used  . Alcohol use No    Review of Systems Constitutional: No fever/chills Eyes: No visual changes. ENT: No sore throat. No stiff neck no neck pain Cardiovascular: Denies chest pain. Respiratory: Denies shortness of breath. Gastrointestinal:   no vomiting.  No diarrhea.  No constipation. Genitourinary: Negative for dysuria. Musculoskeletal: Positive lower extremity swelling Skin: Negative for rash. Neurological: Negative for severe headaches, focal weakness or numbness. 10-point ROS otherwise negative.  ____________________________________________   PHYSICAL EXAM:  VITAL SIGNS: ED Triage Vitals  Enc Vitals Group     BP 04/30/16 1542 138/68     Pulse Rate 04/30/16 1542 78     Resp 04/30/16 1542 18     Temp 04/30/16 1542 97.7 F (36.5 C)     Temp Source 04/30/16 1542 Oral     SpO2 04/30/16 1542 99 %     Weight 04/30/16 1543 (!) 467 lb (211.8 kg)     Height 04/30/16 1543 5\' 11"  (1.803 m)     Head Circumference --      Peak Flow --      Pain Score 04/30/16 1543 3     Pain Loc --      Pain Edu? --  Excl. in [place]? --     Constitutional: Alert and oriented. Well appearing and in no acute distress. Eyes: Conjunctivae are normal. PERRL. EOMI. Head: Atraumatic. Nose: No congestion/rhinnorhea. Mouth/Throat: Mucous membranes are moist.  Oropharynx non-erythematous. Neck: No stridor.   Nontender with no meningismus Cardiovascular: Normal rate, regular rhythm. Grossly normal heart sounds.  Good peripheral circulation. Respiratory: Normal respiratory effort.  No retractions. Lungs CTAB. Abdominal: Soft and nontender. No distention. No guarding no reboundMorbidly obese Back:  There is no focal tenderness or step off.  there is no midline tenderness there are no lesions noted. there is no CVA  tenderness Musculoskeletal: Morbid obesity limits exam there is swelling to the right lower shortly versus the left with mild erythema, not hot to touch, there is strong distal pulses.  Neurologic:  Normal speech and language. No gross focal neurologic deficits are appreciated.  Skin:  Skin is warm, dry and intact. No rash noted. Psychiatric: Mood and affect are normal. Speech and behavior are normal.  ____________________________________________   LABS (all labs ordered are listed, but only abnormal results are displayed)  Labs Reviewed  APTT  BASIC METABOLIC PANEL  CBC  PROTIME-INR   ____________________________________________  EKG  I personally interpreted any EKGs ordered by me or triage  ____________________________________________  RADIOLOGY  I reviewed any imaging ordered by me or triage that were performed during my shift and, if possible, patient and/or family made aware of any abnormal findings. ____________________________________________   PROCEDURES  Procedure(s) performed: None  Procedures  Critical Care performed: None  ____________________________________________   INITIAL IMPRESSION / ASSESSMENT AND PLAN / ED COURSE  Pertinent labs & imaging results that were available during my care of the patient were reviewed by me and considered in my medical decision making (see chart for details).  Morbidly obese gentleman with a short segment nonocclusive right popliteal DVT with no evidence of extension into the femoral vein. He has no absolute anticoagulation contraindications no history of bleeding to his brain or GI bleeding or recent surgery, that he can tell me of.  Patient should be anticoagulated for this, however, his morbid obesity makes this somewhat difficult. Lovenox and Coumadin to be employed that it would require large doses of home Lovenox, Xarelto or one of the other new or modalities as an option as well however it's use in morbid obesity is  somewhat unknown to me. We will discuss with hematology what they would recommend.  ----------------------------------------- 7:27 PM on 04/30/2016 -----------------------------------------  Discussed with Dr. Grayland Ormond, of hematology. This is a very difficult patient to treat. Patient would if we elected to go with Coumadin and Lovenox would initially need to self administer very large doses of low molecular weight heparin subcutaneously every day and he'll be very difficult also according to hematology to maintain him therapeutically on his Coumadin. This is concerning, as I suspect the patient will have great difficulty with this. However, there is a paucity of data about the efficacy of Xarelto in this population. After discussing all of these various considerations, Dr. Grayland Ormond feels that Xarelto is a reasonable option at the regular dosing. I think this is not an unreasonable course of action. It's an absence of data in this patient based which is the only concern. We will give him extensive return precautions for any swelling that worsens, chest pain or shortness of breath without follow up close with primary care doctor the next day or so   ----------------------------------------- 7:53 PM on 04/30/2016 ----------------------------------------- D/w Dr. Rockwell Germany, pcp  on call, who agrees w/ mgt and will f/u.   Clinical Course   ____________________________________________   FINAL CLINICAL IMPRESSION(S) / ED DIAGNOSES  Final diagnoses:  None      This chart was dictated using voice recognition software.  Despite best efforts to proofread,  errors can occur which can change meaning.      Schuyler Amor, MD 04/30/16 [place], MD 04/30/16 [place], MD 04/30/16 [phone]

## 2016-04-30 NOTE — ED Triage Notes (Signed)
Pt sent from outpatient ultrasound with c/o RLE pain with swelling since Sunday with a nonocclusive clot found with ultrasound.Eric Berg

## 2016-04-30 NOTE — ED Notes (Signed)
Pt states he went to [facility] for leg swelling and had an ultrasound done today which showed a blood clot.  Pt states he has never had a blood clot before.

## 2016-05-25 ENCOUNTER — Encounter: Payer: Medicare Other | Attending: General Surgery | Admitting: Dietician

## 2016-05-25 NOTE — Progress Notes (Signed)
6 Months Supervised Weight Loss Visit:   Pre-Operative sleeve gastrectomy Surgery  Medical Nutrition Therapy:  Appt start time: 1120 end time:  1135  Primary concerns today: Supervised Weight Loss Visit. Eric Berg returns today having gained 1 pound. He recently had a blood clot in his leg and has not been as active lately. Tried powder protein and did not like it; prefers to stick with premade shakes. Having a hard time avoiding soda at work. He knows he needs to stop drinking soda. He has also been tempted by chocolate. He is feeling nervous about having surgery but he is excited about the positive changes. Tried the lower sodium deli [place] and likes it. Has been attending a day program 2x a week at his church (crafts, worship, activity).   Goal: 441 lbs  Weight: 470.6 lbs BMI: 65.6   Preferred Learning Style:   Auditory  No preference indicated   Learning Readiness:   Ready  Progress Towards Goal(s):  In progress.  24-hour recall: B: Slim Fast low carb shake L: salami and cheese sandwich D: 2 hamburgers S: sugar free pudding or Fiber One bar or yogurt    Handouts given during visit include:  none   Nutritional Diagnosis:  [name]-3.3 Obesity related to past poor dietary habits and physical inactivity as evidenced by patient attending supervised weight loss for insurance approval of bariatric surgery.    Intervention:  Nutrition counseling provided. -Continue to drink water at work instead of soda -Ask for the lower sodium deli meat  -Have 2 slices of [place] and 2 slices of cheese -Practice getting into the habit of chewing each bite 20-30 times -Try mixing chocolate protein powder in milk (think of this as a meal or snack)  Teaching Method Utilized:  Visual Auditory Hands on  Barriers to learning/adherence to lifestyle change: suspected learning deficit  Demonstrated degree of understanding via:  Teach Back   Monitoring/Evaluation:  Dietary intake, exercise, and body  weight. Follow up in 1 months for 6 month supervised weight loss visit.

## 2016-05-25 NOTE — Patient Instructions (Addendum)
Make sure the Slim Fast has 5 grams of carbohydrates or less per serving and 15 grams of protein or more per serving. (Low carb version). Work on cutting back on portion sizes.  Cut out soda completely. Work on chewing well and taking 20 minutes to eat. Start working on waiting 30 minutes to drink after eating.  Try 1% milk.  Practice getting into the habit of chewing each bite 20-30 times  1. Try getting the "no sugar added" Fudgsicles for your chocolate craving and try to stick to 1 or 2 at a time  2. Try having steamer bags of veggies with dinner  3. Increase physical activity (walking and pool exercises)

## 2016-06-22 ENCOUNTER — Encounter: Payer: Self-pay | Admitting: Dietician

## 2016-06-22 ENCOUNTER — Encounter: Payer: Medicare Other | Attending: General Surgery | Admitting: Dietician

## 2016-06-22 NOTE — Progress Notes (Signed)
6 Months Supervised Weight Loss Visit:   Pre-Operative sleeve gastrectomy Surgery  Medical Nutrition Therapy:  Appt start time: 1130 end time:  1145  Primary concerns today: Supervised Weight Loss Visit. Fritz Pickerel returns today having lost half a pound. 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 Notices that he eats for emotional reasons with negative feelings. Has been going to church activities 2 days a week and swimming. Thinking about starting flag football and basketball.   Goal: 441 lbs  Weight: 470 lbs BMI: 65.6   Preferred Learning Style:   Auditory  No preference indicated   Learning Readiness:   Ready  Progress Towards Goal(s):  In progress.  24-hour recall: B: Slim Fast low carb shake L: salami and cheese sandwich D: 2 hamburgers S: sugar free pudding or Fiber One bar or yogurt    Handouts given during visit include:  none   Nutritional Diagnosis:  [place]-3.3 Obesity related to past poor dietary habits and physical inactivity as evidenced by patient attending supervised weight loss for insurance approval of bariatric surgery.    Intervention:  Nutrition counseling provided. -Continue to drink water at work instead of soda -Ask for the lower sodium deli meat  -Have 2 slices of [place] and 2 slices of cheese -Practice getting into the habit of chewing each bite 20-30 times -Try mixing chocolate protein powder in milk (think of this as a meal or snack)  Teaching Method Utilized:  Visual Auditory Hands on  Barriers to learning/adherence to lifestyle change: suspected learning deficit  Demonstrated degree of understanding via:  Teach Back   Monitoring/Evaluation:  Dietary intake, exercise, and body weight. Follow up in 1 months for 6 month supervised weight loss visit.

## 2016-06-22 NOTE — Patient Instructions (Addendum)
-  Work with your therapist about ways to deal with negative emotions  *No more soda!  -Avoid pretzels and Combos  Focus on protein foods -eggs, egg salad, cheese, deli meat, beans, beef/hamburger, weenies, chicken, seafood, protein shake

## 2016-07-20 ENCOUNTER — Encounter: Payer: Self-pay | Admitting: Dietician

## 2016-07-20 ENCOUNTER — Encounter: Payer: Medicare Other | Attending: General Surgery | Admitting: Dietician

## 2016-07-20 NOTE — Progress Notes (Signed)
6 Months Supervised Weight Loss Visit:   Pre-Operative sleeve gastrectomy Surgery  Medical Nutrition Therapy:  Appt start time: 1200 end time:  1215  Primary concerns today: Supervised Weight Loss Visit. Eric Berg returns today having lost a pound. He is still drinking sodas, mostly on the weekends at home. He met with his therapist yesterday and they discussed going for a walk when he is feeling bad. Working at [employer] on Wednesdays and Fridays and goes to a church program Tuesdays and Thursdays. Has been working on reducing pretzels and combos; however he has been eating more sweets.    Patient indicates that he is not yet ready for bariatric surgery. He would like to continue to work on goals and be more prepared when he has surgery. He plans to continue to follow up monthly.  Goal: 441 lbs  Weight: 469 lbs BMI: 65.6   Preferred Learning Style:   Auditory  No preference indicated   Learning Readiness:   Ready  Progress Towards Goal(s):  In progress.  24-hour recall: B: Slim Fast low carb shake L: salami and cheese sandwich D: 2 hamburgers S: sugar free pudding or Fiber One bar or yogurt    Handouts given during visit include:  none   Nutritional Diagnosis:  [place]-3.3 Obesity related to past poor dietary habits and physical inactivity as evidenced by patient attending supervised weight loss for insurance approval of bariatric surgery.    Intervention:  Nutrition counseling provided. -Keep working on reducing sodas  -Cut soda in half (by 2 cans to last you the weekend)  -Instead have sugar free drinks: Crystal Light, water, Powerade Zero, ICE water  Teaching Method Utilized:  Visual Auditory Hands on  Barriers to learning/adherence to lifestyle change: suspected learning deficit  Demonstrated degree of understanding via:  Teach Back   Monitoring/Evaluation:  Dietary intake, exercise, and body weight. Follow up in 1 months for 6 month supervised weight loss  visit.

## 2016-07-20 NOTE — Patient Instructions (Addendum)
-  Keep working on reducing sodas  -Cut soda in half (by 2 cans to last you the weekend)  -Instead have sugar free drinks: Crystal Light, water, Powerade Zero, ICE water

## 2016-08-24 ENCOUNTER — Encounter: Payer: Medicare Other | Attending: General Surgery | Admitting: Dietician

## 2016-08-24 NOTE — Progress Notes (Signed)
6 Months Supervised Weight Loss Visit:   Pre-Operative sleeve gastrectomy Surgery  Medical Nutrition Therapy:  Appt start time: [id number] end time:  1120  Primary concerns today: Supervised Weight Loss Visit. Eric Berg returns today having maintained his weight. 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 He has not been sleeping well. Plans to start basketball in January.   Patient indicates that he is not yet ready for bariatric surgery. He would like to continue to work on goals and be more prepared when he has surgery. He plans to continue to follow up monthly.  Goal: 441 lbs  Weight: 469 lbs BMI: 65.6   Preferred Learning Style:   Auditory  No preference indicated   Learning Readiness:   Ready  Progress Towards Goal(s):  In progress.  24-hour recall: B: Slim Fast low carb shake L: salami and cheese sandwich D: 2 hamburgers S: sugar free pudding or Fiber One bar or yogurt    Handouts given during visit include:  none   Nutritional Diagnosis:  [place]-3.3 Obesity related to past poor dietary habits and physical inactivity as evidenced by patient attending supervised weight loss for insurance approval of bariatric surgery.    Intervention:  Nutrition counseling provided. -Keep working on reducing sodas  -Cut soda in half (by 2 cans to last you the weekend)  -Instead have sugar free drinks: Crystal Light, water, Powerade Zero, ICE water  Teaching Method Utilized:  Visual Auditory Hands on  Barriers to learning/adherence to lifestyle change: suspected learning deficit  Demonstrated degree of understanding via:  Teach Back   Monitoring/Evaluation:  Dietary intake, exercise, and body weight. Follow up in 1 months for 6 month supervised weight loss visit.

## 2016-08-24 NOTE — Patient Instructions (Addendum)
-  Keep working on reducing sodas  -Cut soda in half (by 2 cans to last you the weekend)  -Instead have sugar free drinks: Crystal Light, water, Powerade Zero, ICE water  -Call your therapist and make an appointment for as soon as she gets back

## 2016-10-05 ENCOUNTER — Encounter: Payer: Medicare Other | Attending: General Surgery | Admitting: Dietician

## 2016-10-05 ENCOUNTER — Encounter: Payer: Self-pay | Admitting: Dietician

## 2016-10-05 NOTE — Patient Instructions (Addendum)
-  Keep working on reducing sodas  -Cut soda in half (by 2 cans to last you the weekend)  -Instead have sugar free drinks: Crystal Light, water, Powerade Zero, ICE water, diet V8 Splash  -Try mixing seltzer water with diet V8 Splash

## 2016-10-05 NOTE — Progress Notes (Signed)
6 Months Supervised Weight Loss Visit:   Pre-Operative sleeve gastrectomy Surgery  Medical Nutrition Therapy:  Appt start time: [id number] end time:  1120  Primary concerns today: Supervised Weight Loss Visit. Eric Berg returns having gained 2 pounds. 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 Next basketball practice February 10 and games at the end of February and beginning of March. Has been able to still go to the day program and working 2 days a week. Saw therapist again this month.   Patient indicates that he is not yet ready for bariatric surgery. He would like to continue to work on goals and be more prepared when he has surgery. He plans to continue to follow up monthly.  Goal: 441 lbs  Weight: 471.5 lbs BMI: 65.6   Preferred Learning Style:   Auditory  No preference indicated   Learning Readiness:   Ready  Progress Towards Goal(s):  In progress.  24-hour recall: B: Slim Fast low carb shake L: salami and cheese sandwich D: 2 hamburgers S: sugar free pudding or Fiber One bar or yogurt    Handouts given during visit include:  none   Nutritional Diagnosis:  [place]-3.3 Obesity related to past poor dietary habits and physical inactivity as evidenced by patient attending supervised weight loss for insurance approval of bariatric surgery.    Intervention:  Nutrition counseling provided. -Keep working on reducing sodas  -Cut soda in half (by 2 cans to last you the weekend)  -Instead have sugar free drinks: Crystal Light, water, Powerade Zero, ICE water  Teaching Method Utilized:  Visual Auditory Hands on  Barriers to learning/adherence to lifestyle change: suspected learning deficit  Demonstrated degree of understanding via:  Teach Back   Monitoring/Evaluation:  Dietary intake, exercise, and body weight. Follow up in 1 months for 6 month supervised weight loss visit.

## 2016-11-02 ENCOUNTER — Encounter: Payer: Medicare Other | Attending: General Surgery | Admitting: Skilled Nursing Facility1

## 2016-11-02 DIAGNOSIS — F419 Anxiety disorder, unspecified: Secondary | ICD-10-CM | POA: Insufficient documentation

## 2016-11-02 DIAGNOSIS — E669 Obesity, unspecified: Secondary | ICD-10-CM

## 2016-11-02 DIAGNOSIS — E119 Type 2 diabetes mellitus without complications: Secondary | ICD-10-CM | POA: Insufficient documentation

## 2016-11-02 DIAGNOSIS — I10 Essential (primary) hypertension: Secondary | ICD-10-CM | POA: Insufficient documentation

## 2016-11-02 DIAGNOSIS — Z713 Dietary counseling and surveillance: Secondary | ICD-10-CM | POA: Diagnosis not present

## 2016-11-02 NOTE — Progress Notes (Signed)
6 Months Supervised Weight Loss Visit:   Pre-Operative sleeve gastrectomy Surgery  Medical Nutrition Therapy:  Appt start time: [id number] end time:  1120  Primary concerns today: Supervised Weight Loss Visit. Eric Berg returns having gained 9.3 pounds. Patient indicates that he is not yet ready for bariatric surgery. He would like to continue to work on goals and be more prepared when he has surgery. He plans to continue to follow up monthly.   Pt states he had a really bad week and coped with food, Eating fast food and sweets due to anxiety. Pt states he Will open a savings account for him and his wifes pay check. 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 Pt states he is not a water person. Pt states he Wants a 60 inch smart tv whne he save sup his money.   Goals to play full court for next year  Goal: 441 lbs  Weight: 480.8 lbs BMI: 67.02  Preferred Learning Style:   Auditory  No preference indicated   Learning Readiness:   Ready  Progress Towards Goal(s):  In progress.  24-hour recall: B: Slim Fast low carb shake L: salami and cheese sandwich D: 2 hamburgers S: sugar free pudding or Fiber One bar or yogurt    Handouts given during visit include:  none   Nutritional Diagnosis:  [place]-3.3 Obesity related to past poor dietary habits and physical inactivity as evidenced by patient attending supervised weight loss for insurance approval of bariatric surgery.    Intervention:  Nutrition counseling provided. Goals: Foods That are not good for your body:   -Cupcakes  -Cookies  -Anything deep fried  -Honey Buns  -Soda  -Candy  -When you are feeling upset try to go for a walk -Eat dinner at the table Food that are good for your body:   -Any fruit  -Any vegetable  -Whole wheat bread  -Lean protein options: chicken without the skin, lean pork, lean beef, 2% cheese,        fish -Try tunafish  again   Teaching Method Utilized:  Visual Auditory Hands on  Barriers to learning/adherence to lifestyle change: suspected learning deficit  Demonstrated degree of understanding via:  Teach Back   Monitoring/Evaluation:  Dietary intake, exercise, and body weight. Follow up in 1 months for 6 month supervised weight loss visit.

## 2016-11-02 NOTE — Patient Instructions (Addendum)
Foods That are not good for your body:   -Cupcakes  -Cookies  -Anything deep fried  -Honey Buns  -Soda  -Candy  -When you are feeling upset try to go for a walk  -Eat dinner at the table   Food that are good for your body:   -Any fruit  -Any vegetable  -Whole wheat bread  -Lean protein options: chicken without the skin, lean pork, lean beef, 2% cheese,            fish  -Try tunafish again

## 2016-11-30 ENCOUNTER — Encounter: Payer: Medicare Other | Attending: General Surgery | Admitting: Skilled Nursing Facility1

## 2016-11-30 ENCOUNTER — Encounter: Payer: Self-pay | Admitting: Skilled Nursing Facility1

## 2016-11-30 DIAGNOSIS — I10 Essential (primary) hypertension: Secondary | ICD-10-CM | POA: Diagnosis not present

## 2016-11-30 DIAGNOSIS — E119 Type 2 diabetes mellitus without complications: Secondary | ICD-10-CM | POA: Insufficient documentation

## 2016-11-30 DIAGNOSIS — Z713 Dietary counseling and surveillance: Secondary | ICD-10-CM | POA: Insufficient documentation

## 2016-11-30 DIAGNOSIS — E669 Obesity, unspecified: Secondary | ICD-10-CM

## 2016-11-30 DIAGNOSIS — F419 Anxiety disorder, unspecified: Secondary | ICD-10-CM | POA: Insufficient documentation

## 2016-11-30 NOTE — Progress Notes (Signed)
6 Months Supervised Weight Loss Visit:   Pre-Operative sleeve gastrectomy Surgery  Medical Nutrition Therapy:  Appt start time: 9935 end time:  1120  Primary concerns today: Supervised Weight Loss Visit. Eric Berg returns having lost about 5 pounds.  Pt states he is not a water person. Pt states he Wants a 60 inch smart tv whne he save sup his money.   Pt states he has been playing basketball and cut back on his soda consumption and has been cleaning in the house and trying walking more. Pt states he wants to add a Basketball hoop in the driveway.Pt states he Wants to play at he gym on Saturday. Pt was given Strawberry premier protein to try. Pt states by January he plans to play 5 on 5 full court basketball. Pt states he thinks he sabotaged himself because he is afraid of getting the surgery: anaesthesia and if he will survive it.   Goals to play full court for next year  Goal to be 441 lbs to qualify for surgery  Weight: 475.5 lbs BMI: 66.5  Preferred Learning Style:   Auditory  No preference indicated   Learning Readiness:   Ready  Progress Towards Goal(s):  In progress.  24-hour recall: B: Slim Fast low carb shake L: salami and cheese sandwich D: 2 hamburgers S: sugar free pudding or Fiber One bar or yogurt    Handouts given during visit include:  none   Nutritional Diagnosis:  [place]-3.3 Obesity related to past poor dietary habits and physical inactivity as evidenced by patient attending supervised weight loss for insurance approval of bariatric surgery.    Intervention:  Nutrition counseling provided. Goals: -Try premier protein -Multivitamin complete with iron: [place] complete  Teaching Method Utilized:  Visual Auditory Hands on  Barriers to learning/adherence to lifestyle change: suspected learning deficit  Demonstrated degree of understanding via:  Teach Back   Monitoring/Evaluation:  Dietary intake, exercise, and body weight.

## 2016-11-30 NOTE — Patient Instructions (Signed)
-  Try premier protein  -Multivitamin complete with iron: Flinestones complete

## 2016-12-02 ENCOUNTER — Ambulatory Visit: Payer: Medicare Other | Admitting: Skilled Nursing Facility1

## 2016-12-03 ENCOUNTER — Ambulatory Visit: Payer: Medicare Other | Admitting: Oncology

## 2016-12-06 ENCOUNTER — Ambulatory Visit: Payer: Medicare Other | Admitting: Oncology

## 2016-12-13 ENCOUNTER — Ambulatory Visit: Payer: Medicare Other | Admitting: Oncology

## 2016-12-15 ENCOUNTER — Ambulatory Visit: Payer: Medicare Other | Admitting: Hematology and Oncology

## 2016-12-27 ENCOUNTER — Inpatient Hospital Stay: Payer: Medicare Other | Admitting: Oncology

## 2017-01-03 ENCOUNTER — Encounter: Payer: Medicare Other | Attending: General Surgery | Admitting: Skilled Nursing Facility1

## 2017-01-03 ENCOUNTER — Encounter: Payer: Self-pay | Admitting: Skilled Nursing Facility1

## 2017-01-03 DIAGNOSIS — Z713 Dietary counseling and surveillance: Secondary | ICD-10-CM | POA: Diagnosis not present

## 2017-01-03 DIAGNOSIS — E119 Type 2 diabetes mellitus without complications: Secondary | ICD-10-CM | POA: Insufficient documentation

## 2017-01-03 DIAGNOSIS — I10 Essential (primary) hypertension: Secondary | ICD-10-CM | POA: Insufficient documentation

## 2017-01-03 DIAGNOSIS — F419 Anxiety disorder, unspecified: Secondary | ICD-10-CM | POA: Insufficient documentation

## 2017-01-03 NOTE — Progress Notes (Signed)
6 Months Supervised Weight Loss Visit:   Pre-Operative sleeve gastrectomy Surgery  Medical Nutrition Therapy:  Appt start time: 4097 end time:  1120  Primary concerns today: Supervised Weight Loss Visit. Eric Berg returns having lost about 5 pounds.  Pt states he is not a water person. Pt states he Wants a 60 inch smart tv when he saves up his money.   Pt states he has been playing basketball and cut back on his soda consumption and has been cleaning in the house and trying walking more. Pt states he wants to add a Basketball hoop in the driveway.Pt states he Wants to play at he gym on Saturday. Pt was given Strawberry premier protein to try. Pt states by January he plans to play 5 on 5 full court basketball. Pt states he thinks he sabotaged himself because he is afraid of getting the surgery: anaesthesia and if he will survive it are the concerns.   Pt states he got his tax return money and lsot control with buying food and eating out. Pt states when he has money he Splurges with food. Pt states he Hurt his shoulder so he could not go to work for the week. Pt states he wants to Give the staff his debit card so he will not buy a lot of extra food and soda.Pt states he Ate out 3 times last week. Pt states he drinks 2 2 liters of soda a day. Pt states Next month he is getting a protable basketball hoop. [t states he is not going to lie he has been eating candy and honey buns.  Goals to play full court for next year  Goal to be 441 lbs to qualify for surgery  Weight: 475.5 lbs BMI: 66.5  Preferred Learning Style:   Auditory  No preference indicated   Learning Readiness:   Ready  Progress Towards Goal(s):  In progress.  24-hour recall: B: Slim Fast low carb shake L: salami and cheese sandwich D: 2 hamburgers S: sugar free pudding or Fiber One bar or yogurt    Handouts given during visit include:  none   Nutritional Diagnosis:  [place]-3.3 Obesity related to past poor dietary habits and  physical inactivity as evidenced by patient attending supervised weight loss for insurance approval of bariatric surgery.    Intervention:  Nutrition counseling provided. Goals: (pt set these up) -Have the staff hold on to your debit card and only buy the 2 2 liters of soda when you go to the store with them -Dribble the basketball and drink water instead of going to the convenience store -2 2Liters of a soda a month instead of every day -Buy a multivitamin with iron -Talk to your therapist about your relationship with food Teaching Method Utilized:  Visual Auditory Hands on  Barriers to learning/adherence to lifestyle change: suspected learning deficit  Demonstrated degree of understanding via:  Teach Back   Monitoring/Evaluation:  Dietary intake, exercise, and body weight.

## 2017-01-10 ENCOUNTER — Inpatient Hospital Stay: Payer: Medicare Other

## 2017-01-10 ENCOUNTER — Inpatient Hospital Stay: Payer: Medicare Other | Attending: Oncology | Admitting: Oncology

## 2017-01-10 ENCOUNTER — Encounter: Payer: Self-pay | Admitting: Oncology

## 2017-01-10 VITALS — BP 136/89 | HR 76 | Temp 97.6°F | Ht 71.0 in | Wt >= 6400 oz

## 2017-01-10 DIAGNOSIS — Z79899 Other long term (current) drug therapy: Secondary | ICD-10-CM | POA: Insufficient documentation

## 2017-01-10 DIAGNOSIS — Z86711 Personal history of pulmonary embolism: Secondary | ICD-10-CM

## 2017-01-10 DIAGNOSIS — M7989 Other specified soft tissue disorders: Secondary | ICD-10-CM | POA: Diagnosis not present

## 2017-01-10 DIAGNOSIS — I872 Venous insufficiency (chronic) (peripheral): Secondary | ICD-10-CM | POA: Diagnosis not present

## 2017-01-10 DIAGNOSIS — I82409 Acute embolism and thrombosis of unspecified deep veins of unspecified lower extremity: Secondary | ICD-10-CM | POA: Insufficient documentation

## 2017-01-10 DIAGNOSIS — E119 Type 2 diabetes mellitus without complications: Secondary | ICD-10-CM | POA: Diagnosis not present

## 2017-01-10 DIAGNOSIS — Z7901 Long term (current) use of anticoagulants: Secondary | ICD-10-CM | POA: Insufficient documentation

## 2017-01-10 DIAGNOSIS — Z86718 Personal history of other venous thrombosis and embolism: Secondary | ICD-10-CM | POA: Insufficient documentation

## 2017-01-10 DIAGNOSIS — I10 Essential (primary) hypertension: Secondary | ICD-10-CM | POA: Insufficient documentation

## 2017-01-10 DIAGNOSIS — E669 Obesity, unspecified: Secondary | ICD-10-CM | POA: Insufficient documentation

## 2017-01-10 DIAGNOSIS — M109 Gout, unspecified: Secondary | ICD-10-CM | POA: Diagnosis not present

## 2017-01-10 DIAGNOSIS — Z7984 Long term (current) use of oral hypoglycemic drugs: Secondary | ICD-10-CM | POA: Insufficient documentation

## 2017-01-10 DIAGNOSIS — I82531 Chronic embolism and thrombosis of right popliteal vein: Secondary | ICD-10-CM

## 2017-01-10 DIAGNOSIS — J45909 Unspecified asthma, uncomplicated: Secondary | ICD-10-CM | POA: Diagnosis not present

## 2017-01-10 LAB — COMPREHENSIVE METABOLIC PANEL
ALT: 14 U/L — ABNORMAL LOW (ref 17–63)
ANION GAP: 6 (ref 5–15)
AST: 19 U/L (ref 15–41)
Albumin: 3.7 g/dL (ref 3.5–5.0)
Alkaline Phosphatase: 53 U/L (ref 38–126)
BUN: 14 mg/dL (ref 6–20)
CHLORIDE: 101 mmol/L (ref 101–111)
CO2: 27 mmol/L (ref 22–32)
Calcium: 8.7 mg/dL — ABNORMAL LOW (ref 8.9–10.3)
Creatinine, Ser: 0.74 mg/dL (ref 0.61–1.24)
Glucose, Bld: 106 mg/dL — ABNORMAL HIGH (ref 65–99)
POTASSIUM: 4 mmol/L (ref 3.5–5.1)
Sodium: 134 mmol/L — ABNORMAL LOW (ref 135–145)
Total Bilirubin: 0.5 mg/dL (ref 0.3–1.2)
Total Protein: 7.9 g/dL (ref 6.5–8.1)

## 2017-01-10 LAB — CBC
HCT: 44.2 % (ref 40.0–52.0)
Hemoglobin: 14.9 g/dL (ref 13.0–18.0)
MCH: 30.9 pg (ref 26.0–34.0)
MCHC: 33.6 g/dL (ref 32.0–36.0)
MCV: 92 fL (ref 80.0–100.0)
PLATELETS: 254 10*3/uL (ref 150–440)
RBC: 4.8 MIL/uL (ref 4.40–5.90)
RDW: 13.7 % (ref 11.5–14.5)
WBC: 11.7 10*3/uL — AB (ref 3.8–10.6)

## 2017-01-10 NOTE — Progress Notes (Signed)
Hematology/Oncology Consult note [facility] Telephone:([phone] [phone]  Patient Care Team: Letta Median, MD as PCP - General (Family Medicine)   Name of the patient: Eric Berg  [id number]  [birth year]    Reason for referral- anticoagulation management for right lower extremity DVT   Referring physician- Dr. Brandt Loosen  Date of visit: 01/10/17   History of presenting illness- Patient is a 56 yr old morbidly obese patient who was diagnosed with right proximal extremity DVT in august 2017. Doppler on 04/30/16 showed: Short segment nonocclusive right popliteal DVT over a short segment. Very low thrombus burden. No evidence of propagation into the femoral veins. He has been on xarelto since. Recent bmp in march 2018 was normal. Patient has not had any prior h/o dvt or pe. h/o possible dvt in his maternal aunt. He works twice a week and attends day program other 2 days a week. He has a relatively sedentary lifestyle. He is working on his weight loss to be able to get to gastric bypass surgery. Reports no bleeding issues on xarelto. No preceding travel or surgery during the time of dvt in august 2017  ECOG PS- 2  Pain scale- 0   Review of systems- Review of Systems  Constitutional: Positive for malaise/fatigue. Negative for chills, fever and weight loss.  HENT: Negative for congestion, ear discharge and nosebleeds.   Eyes: Negative for blurred vision.  Respiratory: Negative for cough, hemoptysis, sputum production, shortness of breath and wheezing.   Cardiovascular: Positive for leg swelling. Negative for chest pain, palpitations, orthopnea and claudication.  Gastrointestinal: Negative for abdominal pain, blood in stool, constipation, diarrhea, heartburn, melena, nausea and vomiting.  Genitourinary: Negative for dysuria, flank pain, frequency, hematuria and urgency.  Musculoskeletal: Negative for back pain, joint pain and myalgias.    Skin: Negative for rash.  Neurological: Negative for dizziness, tingling, focal weakness, seizures, weakness and headaches.  Endo/Heme/Allergies: Does not bruise/bleed easily.  Psychiatric/Behavioral: Negative for depression and suicidal ideas. The patient does not have insomnia.     No Known Allergies  Patient Active Problem List   Diagnosis Date Noted  . Hypertension 01/10/2017  . DVT (deep venous thrombosis) ([place]) 01/10/2017  . Morbid obesity ([place]) 01/10/2017     Past Medical History:  Diagnosis Date  . Asthma   . Diabetes mellitus without complication ([place])   . DVT (deep venous thrombosis) ([place]) 04/2016  . Gout   . Hematuria   . Hypertension   . Morbid (severe) obesity due to excess calories ([place])   . Stasis dermatitis      Past Surgical History:  Procedure Laterality Date  . EYE SURGERY      Social History   Social History  . Marital status: Married    Spouse name: N/A  . Number of children: N/A  . Years of education: N/A   Occupational History  . Not on file.   Social History Main Topics  . Smoking status: Never Smoker  . Smokeless tobacco: Never Used  . Alcohol use No  . Drug use: No  . Sexual activity: Not on file   Other Topics Concern  . Not on file   Social History Narrative  . No narrative on file     Maternal aunt had dvt   Current Outpatient Prescriptions:  .  albuterol (PROVENTIL HFA;VENTOLIN HFA) 108 (90 Base) MCG/ACT inhaler, Inhale into the lungs., Disp: , Rfl:  .  atenolol (TENORMIN) 100 MG tablet, Take 100 mg by mouth daily.,  Disp: , Rfl:  .  buPROPion (WELLBUTRIN) 100 MG tablet, Take 100 mg by mouth 2 (two) times daily., Disp: , Rfl:  .  clobetasol ointment (TEMOVATE) 4.16 %, Apply 1 application topically 2 (two) times daily., Disp: , Rfl:  .  Colchicine 0.6 MG CAPS, Take 2 tablets by mouth as needed., Disp: , Rfl:  .  desvenlafaxine (PRISTIQ) 100 MG 24 hr tablet, Take 100 mg by mouth daily., Disp: , Rfl:  .  furosemide  (LASIX) 20 MG tablet, Take 20 mg by mouth., Disp: , Rfl:  .  haloperidol (HALDOL) 2 MG tablet, Take 2 mg by mouth 2 (two) times daily., Disp: , Rfl:  .  lisdexamfetamine (VYVANSE) 40 MG capsule, Take 60 mg by mouth every morning. , Disp: , Rfl:  .  metFORMIN (GLUCOPHAGE) 500 MG tablet, Take by mouth 2 (two) times daily with a meal., Disp: , Rfl:  .  quinapril (ACCUPRIL) 20 MG tablet, Take 20 mg by mouth at bedtime., Disp: , Rfl:  .  Rivaroxaban 15 & 20 MG TBPK, Take as directed on package: Start with one 15mg  tablet by mouth twice a day with food. On Day 22, switch to one 20mg  tablet once a day with food., Disp: 51 each, Rfl: 0 .  traZODone (DESYREL) 150 MG tablet, Take by mouth at bedtime., Disp: , Rfl:  .  VYVANSE 60 MG capsule, , Disp: , Rfl:  .  XARELTO 20 MG TABS tablet, , Disp: , Rfl:    Physical exam:  Vitals:   01/10/17 1034  BP: 136/89  Pulse: 76  Temp: 97.6 F (36.4 C)  TempSrc: Tympanic  Weight: (!) 494 lb 9.6 oz (224.3 kg)  Height: 5\' 11"  (1.803 m)   Physical Exam  Constitutional: He is oriented to person, place, and time.  Morbidly obese, appears in no acute distress  HENT:  Head: Normocephalic and atraumatic.  Eyes: EOM are normal. Pupils are equal, round, and reactive to light.  Neck: Normal range of motion.  Cardiovascular: Normal rate, regular rhythm and normal heart sounds.   Pulmonary/Chest: Effort normal and breath sounds normal.  Abdominal: Bowel sounds are normal.  Exam limited due to obesity  Musculoskeletal: He exhibits edema (trace bl).  Right lower extremity little more swollen than left. Changes of chronic hyperpigmentation  Neurological: He is alert and oriented to person, place, and time.  Skin: Skin is warm and dry.       CMP Latest Ref Rng & Units 04/30/2016  Glucose 65 - 99 mg/dL 98  BUN 6 - 20 mg/dL 15  Creatinine 0.61 - 1.24 mg/dL 0.71  Sodium 135 - 145 mmol/L 137  Potassium 3.5 - 5.1 mmol/L 4.4  Chloride 101 - 111 mmol/L 102  CO2 22 -  32 mmol/L 26  Calcium 8.9 - 10.3 mg/dL 8.9  Total Protein 6.0 - 8.3 g/dL -  Total Bilirubin 0.3 - 1.2 mg/dL -  Alkaline Phos 39 - 117 units/L -  AST 0 - 37 units/L -  ALT 0 - 53 units/L -   CBC Latest Ref Rng & Units 04/30/2016  WBC 3.8 - 10.6 K/uL 9.3  Hemoglobin 13.0 - 18.0 g/dL 14.4  Hematocrit 40.0 - 52.0 % 41.1  Platelets 150 - 440 K/uL 284     Assessment and plan- Patient is a 56 y.o. male with h/o unprovoked right proximal LE DVT in august 2017  Etiology of DVT- seemingly unprovoked. I would like to get factor V leiden testign and prothrombin gene  mutation as well as antiphospholipid antibody testign at this time. Other hypercoagulable work up cannot be done on anticoagulation. etsting would not change management at this time but could give [place] clue towards etiology which at the end of the day could still be his obesity  With regards to the duration of anticoagulation- patient has multiple risk factors including morbid obesity, male sex, seemingly unprovoked DVT and proximal lower extremity DVT which has a significant risk of recurrence as well as PE in the future. Presently his bleeding risk is not high and hence I would favor continuing indefinite anticoagulation  What anticoagulation to use- I would not favor using xarelto or any other newer anticoagulant given that it has not been studied in patients greater than 200 lb and patient is 494 lb which makes him morbidly obese. I would favor using coumadin as it can be monitored. Patient gets some assistance through [employer] home services and hopefully his wife can help him with compliance as well. No bridging needed with lovenox as it has been >6 months since dvt episode  Thank you for this kind referral and the opportunity to participate in the care of this patient   Visit Diagnosis 1. History of DVT in adulthood   2. 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 Randa Evens, MD,  [facility] Pager- [phone] 01/10/2017

## 2017-01-10 NOTE — Progress Notes (Signed)
Patient here for initial visit. He has no complaints of pain today. Sleep normal, appetite good, dyspnea with exertion.

## 2017-01-11 LAB — HEX PHASE PHOSPHOLIPID REFLEX

## 2017-01-11 LAB — HEXAGONAL PHASE PHOSPHOLIPID: HEX PHOSPH NEUT TEST: 0 s (ref 0–11)

## 2017-01-12 LAB — BETA-2-GLYCOPROTEIN I ABS, IGG/M/A

## 2017-01-12 LAB — CARDIOLIPIN ANTIBODIES, IGG, IGM, IGA
Anticardiolipin IgA: <9 APL U/mL (ref 0–11)
Anticardiolipin IgM: <9 MPL U/mL (ref 0–12)

## 2017-01-13 ENCOUNTER — Encounter: Payer: Self-pay | Admitting: Family Medicine

## 2017-01-13 LAB — FACTOR 5 LEIDEN

## 2017-01-14 LAB — PROTHROMBIN GENE MUTATION

## 2017-02-01 ENCOUNTER — Encounter: Payer: Medicare Other | Attending: General Surgery | Admitting: Skilled Nursing Facility1

## 2017-02-01 ENCOUNTER — Encounter: Payer: Self-pay | Admitting: Skilled Nursing Facility1

## 2017-02-01 DIAGNOSIS — F419 Anxiety disorder, unspecified: Secondary | ICD-10-CM | POA: Insufficient documentation

## 2017-02-01 DIAGNOSIS — I10 Essential (primary) hypertension: Secondary | ICD-10-CM | POA: Diagnosis not present

## 2017-02-01 DIAGNOSIS — Z713 Dietary counseling and surveillance: Secondary | ICD-10-CM | POA: Diagnosis not present

## 2017-02-01 DIAGNOSIS — E119 Type 2 diabetes mellitus without complications: Secondary | ICD-10-CM | POA: Diagnosis not present

## 2017-02-01 NOTE — Progress Notes (Signed)
6 Months Supervised Weight Loss Visit:   Pre-Operative sleeve gastrectomy Surgery  Medical Nutrition Therapy:  Appt start time: 3818 end time:  1120  Primary concerns today: Supervised Weight Loss Visit. Eric Berg returns having lost about 5 pounds.  Pt states he is not a water person. Pt states he Wants a 60 inch smart tv when he saves up his money.   Pt returns having lost 6 pounds. Pt states he has been going saturdays to the [facility], 5 minutes on the treadmill in sets and did weight machines and plays basketball in the pool.Pt states once a week he will have a cheat meal and has only had 5 bottles of soda a month instead of 15 or 16 now he has his aids hold his debit card so he cannot buy extra food. Pt states he will try to only have Pizza once a month  And [employer] once a month Pt states he is Going to order a basketball hoop and not eat after 9pm.Pt will try powerade zero instead of gatorade.   Goals to play full court for next year  Goal to be 441 lbs to qualify for surgery  Weight: 475.5 lbs BMI: 66.5  Preferred Learning Style:   Auditory  No preference indicated   Learning Readiness:   Ready  Progress Towards Goal(s):  In progress.  24-hour recall: B: Slim Fast low carb shake L: salami and cheese sandwich D: 2 hamburgers S: sugar free pudding or Fiber One bar or yogurt    Handouts given during visit include:  none   Nutritional Diagnosis:  [place]-3.3 Obesity related to past poor dietary habits and physical inactivity as evidenced by patient attending supervised weight loss for insurance approval of bariatric surgery.    Intervention:  Nutrition counseling provided.  Visual Auditory Hands on  Barriers to learning/adherence to lifestyle change: suspected learning deficit  Demonstrated degree of understanding via:  Teach Back   Monitoring/Evaluation:  Dietary intake, exercise, and body weight.

## 2017-02-28 ENCOUNTER — Encounter: Payer: Self-pay | Admitting: Skilled Nursing Facility1

## 2017-02-28 ENCOUNTER — Encounter: Payer: Medicare Other | Attending: General Surgery | Admitting: Skilled Nursing Facility1

## 2017-02-28 DIAGNOSIS — Z713 Dietary counseling and surveillance: Secondary | ICD-10-CM | POA: Diagnosis not present

## 2017-02-28 DIAGNOSIS — E119 Type 2 diabetes mellitus without complications: Secondary | ICD-10-CM | POA: Diagnosis not present

## 2017-02-28 DIAGNOSIS — F419 Anxiety disorder, unspecified: Secondary | ICD-10-CM | POA: Insufficient documentation

## 2017-02-28 DIAGNOSIS — I10 Essential (primary) hypertension: Secondary | ICD-10-CM | POA: Diagnosis not present

## 2017-02-28 NOTE — Progress Notes (Signed)
6 Months Supervised Weight Loss Visit:   Pre-Operative sleeve gastrectomy Surgery  Medical Nutrition Therapy:  Appt start time: 5093 end time:  1120  Primary concerns today: Supervised Weight Loss Visit. Eric Berg returns having lost about 5 pounds.  Pt states he is not a water person. Pt states he Wants a 60 inch smart tv when he saves up his money.   Pt returns having lost 6 pounds. Pt states he has only had 4 sodas in the month. Pt states he likes the powerade zero. Pt states he has been drinking protein shakes. Pt states the staff still holds his debit card so he will not buy snack foods and soda. Pt states one out of the 2 days he has one personal bag of chips. Pt states he has only had [employer] 2 times this month. Pt states he got his hoop but waiting for them to put it up. Pt states he still works 2 days a week and states he only gets an hour at [employer] instead of 2 hours. Pt states he has only been eating out once a week.   Pt states he has been sick but is better now, having missed a few days a work. Pt asked how much weight until 441 pounds for the surgery. Pt states he wants to take a cruise in 2020. Pt states he has only had pizza once in June. Pt states he cannot be intimate with his wife which is a motivator for the surgery.   Goals to play full court for next year  Goal to be 441 lbs to qualify for surgery  Weight: 475.5 lbs BMI: 66.5  Preferred Learning Style:   Auditory  No preference indicated   Learning Readiness:   Ready  Progress Towards Goal(s):  In progress.  24-hour recall: B: premier protein shake  Pretzels for snack  L: salami and cheese sandwich applesauce  D: 2 hamburgers----steak S: sugar free pudding or Fiber One bar or yogurt    Handouts given during visit include:  none   Nutritional Diagnosis:  [place]-3.3 Obesity related to past poor dietary habits and physical inactivity as evidenced by patient attending supervised weight loss for insurance  approval of bariatric surgery.    Intervention:  Nutrition counseling provided.  Visual Auditory Hands on  Barriers to learning/adherence to lifestyle change: suspected learning deficit  Demonstrated degree of understanding via:  Teach Back   Monitoring/Evaluation:  Dietary intake, exercise, and body weight.

## 2017-03-29 ENCOUNTER — Encounter: Payer: Self-pay | Admitting: Skilled Nursing Facility1

## 2017-03-29 ENCOUNTER — Encounter: Payer: Medicare Other | Attending: General Surgery | Admitting: Skilled Nursing Facility1

## 2017-03-29 DIAGNOSIS — E119 Type 2 diabetes mellitus without complications: Secondary | ICD-10-CM | POA: Diagnosis not present

## 2017-03-29 DIAGNOSIS — I10 Essential (primary) hypertension: Secondary | ICD-10-CM | POA: Diagnosis not present

## 2017-03-29 DIAGNOSIS — F419 Anxiety disorder, unspecified: Secondary | ICD-10-CM | POA: Diagnosis not present

## 2017-03-29 DIAGNOSIS — Z713 Dietary counseling and surveillance: Secondary | ICD-10-CM | POA: Diagnosis not present

## 2017-03-29 NOTE — Progress Notes (Signed)
6 Months Supervised Weight Loss Visit:   Pre-Operative sleeve gastrectomy Surgery  Medical Nutrition Therapy:  Appt start time: 1610 end time:  1120  Primary concerns today: Supervised Weight Loss Visit. Eric Berg returns having lost about 5 pounds.  Pt states he is not a water person. Pt states he Wants a 60 inch smart tv when he saves up his money.  Pt states he wants to take a cruise in 2020.Pt states he cannot be intimate with his wife which is a motivator for the surgery.    Pt arrives having Gained 8 pounds. Pt states he has been Overeating chocolate and soda and feels like there is a knife in his chest from the disappointment of sliding back into oereating. Pt states he feels like he Needs sugar after activity. Pt states he has been Cleaning more, basketball hoop up, treadmill for 10 minutes 5 minutes at a time, weight machines for arms, 2 times a week for 30 minutes. Pt states he does not have diabetes.   Goals to play full court for next year  Goal to be 441 lbs to qualify for surgery  Weight: 487.5 lbs BMI: 67.99  Preferred Learning Style:   Auditory  No preference indicated   Learning Readiness:   Ready  Progress Towards Goal(s):  In progress.  24-hour recall: B: premier protein shake  Pretzels for snack  L: salami and cheese sandwich----skipped applesauce  D: 2 hamburgers----steak---footlong from subway S: sugar free pudding or Fiber One bar or yogurt    Handouts given during visit include:  none   Nutritional Diagnosis:  [place]-3.3 Obesity related to past poor dietary habits and physical inactivity as evidenced by patient attending supervised weight loss for insurance approval of bariatric surgery.    Intervention:  Nutrition counseling provided. Goals: Have vegetables with every lunch With your sandwich have collard greens With your pasta have a salad Talk with your therapist about your relationship with food Aim for 2 water bottles a day using half a pack of  crystal light  Visual Auditory Hands on  Barriers to learning/adherence to lifestyle change: suspected learning deficit  Demonstrated degree of understanding via:  Teach Back   Monitoring/Evaluation:  Dietary intake, exercise, and body weight.

## 2017-04-11 ENCOUNTER — Ambulatory Visit: Payer: Medicare Other | Admitting: Oncology

## 2017-04-25 ENCOUNTER — Encounter: Payer: Medicare Other | Attending: General Surgery | Admitting: Skilled Nursing Facility1

## 2017-04-25 ENCOUNTER — Encounter: Payer: Self-pay | Admitting: Skilled Nursing Facility1

## 2017-04-25 DIAGNOSIS — E119 Type 2 diabetes mellitus without complications: Secondary | ICD-10-CM | POA: Insufficient documentation

## 2017-04-25 DIAGNOSIS — Z713 Dietary counseling and surveillance: Secondary | ICD-10-CM | POA: Insufficient documentation

## 2017-04-25 DIAGNOSIS — I10 Essential (primary) hypertension: Secondary | ICD-10-CM | POA: Diagnosis not present

## 2017-04-25 DIAGNOSIS — F419 Anxiety disorder, unspecified: Secondary | ICD-10-CM | POA: Diagnosis not present

## 2017-04-25 NOTE — Progress Notes (Signed)
6 Months Supervised Weight Loss Visit:   Pre-Operative sleeve gastrectomy Surgery  Medical Nutrition Therapy:  Appt start time: 7639 end time:  1120  Primary concerns today: Supervised Weight Loss Visit. Eric Berg Pt states he has been eating out once a week. Pt states he has been out of vyvanse for a while which has increased his appetite. Pt states he was prescribed metformin for weight loss. Pt states he drinks 1-2 water bottles a day.   Goals to play full court for next year  Goal to be 441 lbs to qualify for surgery  Weight: 488.8 lbs BMI: 68.17  Preferred Learning Style:   Auditory  No preference indicated   Learning Readiness:   Ready  Progress Towards Goal(s):  In progress.  24-hour recall: B: premier protein shake  Pretzels for snack  L: 2 salami and cheese sandwich----skipped applesauce  D: 2 hamburgers----steak---footlong from subway S: protein cookie   Handouts given during visit include:  none   Nutritional Diagnosis:  [place]-3.3 Obesity related to past poor dietary habits and physical inactivity as evidenced by patient attending supervised weight loss for insurance approval of bariatric surgery.    Intervention:  Nutrition counseling provided. Dietitian educated the pt on caloric content of foods.  Goals: Have vegetables with every lunch instead of 2 sandwiches  With your sandwich have collard greens With your pasta have a salad Talk with your therapist about your relationship with food Try to only have one protion Try sugar free natures twist Try only one can of soda Visual Auditory Hands on  Barriers to learning/adherence to lifestyle change: suspected learning deficit  Demonstrated degree of understanding via:  Teach Back   Monitoring/Evaluation:  Dietary intake, exercise, and body weight.

## 2017-05-23 ENCOUNTER — Ambulatory Visit: Payer: Medicare Other | Admitting: Skilled Nursing Facility1

## 2017-05-25 ENCOUNTER — Encounter: Payer: Self-pay | Admitting: Skilled Nursing Facility1

## 2017-05-25 ENCOUNTER — Encounter: Payer: Medicare Other | Attending: General Surgery | Admitting: Skilled Nursing Facility1

## 2017-05-25 DIAGNOSIS — E119 Type 2 diabetes mellitus without complications: Secondary | ICD-10-CM | POA: Diagnosis not present

## 2017-05-25 DIAGNOSIS — Z713 Dietary counseling and surveillance: Secondary | ICD-10-CM | POA: Diagnosis not present

## 2017-05-25 DIAGNOSIS — F419 Anxiety disorder, unspecified: Secondary | ICD-10-CM | POA: Insufficient documentation

## 2017-05-25 DIAGNOSIS — I10 Essential (primary) hypertension: Secondary | ICD-10-CM | POA: Diagnosis not present

## 2017-05-25 NOTE — Progress Notes (Signed)
6 Months Supervised Weight Loss Visit:   Pre-Operative sleeve gastrectomy Surgery  Medical Nutrition Therapy:  Appt start time: 8309 end time:  1120  Primary concerns today: Supervised Weight Loss Visit. Eric Berg  Pt arrived having gained about 10 pounds stateing he has been dealing with a cancer find with food. Pt knows he should not use food in that way but does not know what else to do. 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 Pt states he is Very scared. Pt states he has been drinking 2 water bottles full of water and tried the natures twist sugar free and also the regular.   Goals to play full court for next year  Goal to be 441 lbs to qualify for surgery  Weight: 499 lbs BMI: 69.61  Preferred Learning Style:   Auditory  No preference indicated   Learning Readiness:   Ready  Progress Towards Goal(s):  In progress.  24-hour recall: B: premier protein shake  Pretzels for snack  L: 2 salami and cheese sandwich----skipped applesauce  D: 2 hamburgers----steak---footlong from subway S: protein cookie   Handouts given during visit include:  none   Nutritional Diagnosis:  [name]-3.3 Obesity related to past poor dietary habits and physical inactivity as evidenced by patient attending supervised weight loss for insurance approval of bariatric surgery.    Intervention:  Nutrition counseling provided. Dietitian educated the pt on caloric content of foods.  Goals: Focus on dealing with your new diagnosis and drinking more water Visual Auditory Hands on  Barriers to learning/adherence to lifestyle change: suspected learning deficit  Demonstrated degree of understanding via:  Teach Back   Monitoring/Evaluation:  Dietary intake, exercise, and body weight.

## 2017-07-04 ENCOUNTER — Ambulatory Visit: Payer: Medicare Other | Admitting: Skilled Nursing Facility1

## 2017-07-05 ENCOUNTER — Encounter: Payer: Self-pay | Admitting: Skilled Nursing Facility1

## 2017-07-05 ENCOUNTER — Encounter: Payer: Medicare Other | Attending: General Surgery | Admitting: Skilled Nursing Facility1

## 2017-07-05 DIAGNOSIS — F419 Anxiety disorder, unspecified: Secondary | ICD-10-CM | POA: Diagnosis not present

## 2017-07-05 DIAGNOSIS — I10 Essential (primary) hypertension: Secondary | ICD-10-CM | POA: Insufficient documentation

## 2017-07-05 DIAGNOSIS — Z713 Dietary counseling and surveillance: Secondary | ICD-10-CM | POA: Insufficient documentation

## 2017-07-05 DIAGNOSIS — E119 Type 2 diabetes mellitus without complications: Secondary | ICD-10-CM | POA: Insufficient documentation

## 2017-07-05 NOTE — Progress Notes (Signed)
6 Months Supervised Weight Loss Visit:   Pre-Operative sleeve gastrectomy Surgery  Medical Nutrition Therapy:  Appt start time: 1594 end time:  1120  Primary concerns today: Supervised Weight Loss Visit. Eric Berg  Pt arrives having lost about 10 pounds. 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 Pt states he will be getting 30 treatments of Radiation 5 days a week. Pt states she lays awake at night worrying about work and not being able to play basketball and not being able to sit in certain chairs due to his size. Pt is with meesha mon, tues, wed. Pt states he likes the nature twist sugar free. Pt Skips lunch frequently. Pt may need to miss some appointments due tot radiation.  Goals to play full court for next year  Goal to be 441 lbs to qualify for surgery  Weight: 489 lbs BMI: 68.20  Preferred Learning Style:   Auditory  No preference indicated   Learning Readiness:   Ready  Progress Towards Goal(s):  In progress.  24-hour recall: B: premier protein shake  Pretzels for snack  L: 2 salami and cheese sandwich----skipped applesauce  D: 2 hamburgers----steak---footlong from subway S: protein cookie   Handouts given during visit include:  none   Nutritional Diagnosis:  [place]-3.3 Obesity related to past poor dietary habits and physical inactivity as evidenced by patient attending supervised weight loss for insurance approval of bariatric surgery.    Intervention:  Nutrition counseling provided. Dietitian educated the pt on caloric content of foods.  Goals:  -stop at 1 pack of crackers,  -use sugar free jello -Drink only nateres twist, get enough to last the week, and do not drink soda when you are finished with your natures twist  Visual Auditory Hands on  Barriers to learning/adherence to lifestyle change: suspected learning deficit  Demonstrated degree of understanding via:  Teach Back    Monitoring/Evaluation:  Dietary intake, exercise, and body weight.

## 2017-07-11 ENCOUNTER — Ambulatory Visit: Payer: Medicare Other | Admitting: Surgery

## 2017-07-26 ENCOUNTER — Encounter: Payer: Medicare Other | Attending: General Surgery | Admitting: Skilled Nursing Facility1

## 2017-07-26 ENCOUNTER — Encounter: Payer: Self-pay | Admitting: Skilled Nursing Facility1

## 2017-07-26 DIAGNOSIS — E119 Type 2 diabetes mellitus without complications: Secondary | ICD-10-CM | POA: Diagnosis not present

## 2017-07-26 DIAGNOSIS — I10 Essential (primary) hypertension: Secondary | ICD-10-CM | POA: Diagnosis not present

## 2017-07-26 DIAGNOSIS — Z713 Dietary counseling and surveillance: Secondary | ICD-10-CM | POA: Diagnosis not present

## 2017-07-26 DIAGNOSIS — C801 Malignant (primary) neoplasm, unspecified: Secondary | ICD-10-CM | POA: Insufficient documentation

## 2017-07-26 DIAGNOSIS — F419 Anxiety disorder, unspecified: Secondary | ICD-10-CM | POA: Insufficient documentation

## 2017-07-26 NOTE — Progress Notes (Signed)
6 Months Supervised Weight Loss Visit:   Pre-Operative sleeve gastrectomy Surgery  Medical Nutrition Therapy:  Appt start time: 2119 end time:  1120  Primary concerns today: Supervised Weight Loss Visit. Eric Berg  Goals to play full court for next year   Pt arrives having gained about 5 pounds. Pt states he starts radiation December 3rd, Pt states he has been going to a lot of doctor appointments. Pt states he likes the sugar free twist in the smaller bottles for better control. Pt states he thinks he is drinking less soda due to drinking more sugar free twist. Pt states he has been eating out more due to the doctors appointments.   Goal to be 441 lbs to qualify for surgery  Weight: 489 lbs BMI: 68.20  Preferred Learning Style:   Auditory  No preference indicated   Learning Readiness:   Ready  Progress Towards Goal(s):  In progress.  24-hour recall: B: premier protein shake  Pretzels for snack  L: 2 salami and cheese sandwich----skipped applesauce  D: 2 hamburgers----steak---footlong from subway S: protein cookie, sugar free chocolate pudding   Handouts given during visit include:  none   Nutritional Diagnosis:  [place]-3.3 Obesity related to past poor dietary habits and physical inactivity as evidenced by patient attending supervised weight loss for insurance approval of bariatric surgery.    Intervention:  Nutrition counseling provided. Dietitian educated the pt on caloric content of foods.  Goals:  -stop at 1 pack of crackers,  -use sugar free jello -Drink only nateres twist, get enough to last the week, and do not drink soda when you are finished with your natures twist  Visual Auditory Hands on  Barriers to learning/adherence to lifestyle change: suspected learning deficit  Demonstrated degree of understanding via:  Teach Back   Monitoring/Evaluation:  Dietary intake, exercise, and body weight.

## 2017-10-04 ENCOUNTER — Encounter: Payer: Medicare Other | Attending: General Surgery | Admitting: Skilled Nursing Facility1

## 2017-10-04 ENCOUNTER — Encounter: Payer: Self-pay | Admitting: Skilled Nursing Facility1

## 2017-10-04 DIAGNOSIS — E119 Type 2 diabetes mellitus without complications: Secondary | ICD-10-CM | POA: Diagnosis not present

## 2017-10-04 DIAGNOSIS — F419 Anxiety disorder, unspecified: Secondary | ICD-10-CM | POA: Insufficient documentation

## 2017-10-04 DIAGNOSIS — I10 Essential (primary) hypertension: Secondary | ICD-10-CM | POA: Diagnosis not present

## 2017-10-04 DIAGNOSIS — Z713 Dietary counseling and surveillance: Secondary | ICD-10-CM | POA: Insufficient documentation

## 2017-10-04 NOTE — Progress Notes (Signed)
6 Months Supervised Weight Loss Visit:   Pre-Operative sleeve gastrectomy Surgery  Medical Nutrition Therapy:  Appt start time: 9628 end time:  1120  Primary concerns today: Supervised Weight Loss Visit. Eric Berg  Goals to play full court for next year   Pt arrives having gained about 5 pounds. Pt states he starts radiation December 3rd, Pt states he has been going to a lot of doctor appointments. Pt states he likes the sugar free twist in the smaller bottles for better control. Pt states he thinks he is drinking less soda due to drinking more sugar free twist. Pt states he has been eating out more due to the doctors appointments.   Pt states he has completed radiation. Pt states he has not been working. Pt states he has pulled a muscle a muscle in the back of his leg. Pt states he goes back to work tomorrow and knows he will get very tired. Pt states he has been eating out more and drinking more soda.   Goal to be 441 lbs to qualify for surgery  Weight: 493 lbs BMI: 68.87  Preferred Learning Style:   Auditory  No preference indicated   Learning Readiness:   Ready  Progress Towards Goal(s):  In progress.  24-hour recall: B: premier protein shake  Pretzels for snack  L: 2 salami and cheese sandwich----skipped applesauce  D: 2 hamburgers----steak---footlong from subway S: protein cookie, sugar free chocolate pudding   Handouts given during visit include:  none   Nutritional Diagnosis:  [place]-3.3 Obesity related to past poor dietary habits and physical inactivity as evidenced by patient attending supervised weight loss for insurance approval of bariatric surgery.    Intervention:  Nutrition counseling provided. Dietitian educated the pt on caloric content of foods.  Goals: -Try to get up every hour   -stop at 1 pack of crackers,  -use sugar free jello -Drink only nateres twist, get enough to last the week, and do not drink soda when you are finished with your natures  twist -do not eat fruit packed in syrup -instead of having 2 packs of crackers have one piece of fruit   Visual Auditory Hands on  Barriers to learning/adherence to lifestyle change: suspected learning deficit  Demonstrated degree of understanding via:  Teach Back   Monitoring/Evaluation:  Dietary intake, exercise, and body weight.

## 2017-11-01 ENCOUNTER — Encounter: Payer: Medicare Other | Attending: General Surgery | Admitting: Skilled Nursing Facility1

## 2017-11-01 ENCOUNTER — Encounter: Payer: Self-pay | Admitting: Skilled Nursing Facility1

## 2017-11-01 DIAGNOSIS — F419 Anxiety disorder, unspecified: Secondary | ICD-10-CM | POA: Insufficient documentation

## 2017-11-01 DIAGNOSIS — I10 Essential (primary) hypertension: Secondary | ICD-10-CM | POA: Insufficient documentation

## 2017-11-01 DIAGNOSIS — E119 Type 2 diabetes mellitus without complications: Secondary | ICD-10-CM | POA: Diagnosis not present

## 2017-11-01 DIAGNOSIS — Z713 Dietary counseling and surveillance: Secondary | ICD-10-CM | POA: Insufficient documentation

## 2017-11-01 NOTE — Progress Notes (Signed)
6 Months Supervised Weight Loss Visit:   Pre-Operative sleeve gastrectomy Surgery  Medical Nutrition Therapy:  Appt start time: 4975 end time:  1120  Primary concerns today: Supervised Weight Loss Visit. 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 Pt arrives with a limp. Pt states he is no longer going to go to program and will go to the gym instead using the pool. Pt states he has had Low energy. Pt states he has been Better with soda. Pt states he has beenTrying to drink 3 waters. Pt states he likes sugar free Hawaiian punch. 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  Goal to be 441 lbs to qualify for surgery  Weight: 489 lbs BMI: 68.22  Preferred Learning Style:   Auditory  No preference indicated   Learning Readiness:   Ready  Progress Towards Goal(s):  In progress.  24-hour recall: B: premier protein shake  Pretzels for snack  L: 2 salami and cheese sandwich----skipped applesauce  D: 2 hamburgers----steak---footlong from subway S: protein cookie, sugar free chocolate pudding   Handouts given during visit include:  none   Nutritional Diagnosis:  [place]-3.3 Obesity related to past poor dietary habits and physical inactivity as evidenced by patient attending supervised weight loss for insurance approval of bariatric surgery.    Intervention:  Nutrition counseling provided. Dietitian educated the pt on caloric content of foods.  Goals: -get up after each episode ends or when watching basketball commercials; walk to mailbox and back; some physical therapy if not nice outside -Get in he pool on tuesdays and thursdays  -Do not get seconds   Visual Auditory Hands on  Barriers to learning/adherence to lifestyle change: suspected learning deficit  Demonstrated degree of understanding via:  Teach Back   Monitoring/Evaluation:  Dietary intake, exercise, and body weight.

## 2017-11-14 ENCOUNTER — Encounter: Payer: Self-pay | Admitting: Urology

## 2017-11-14 ENCOUNTER — Ambulatory Visit (INDEPENDENT_AMBULATORY_CARE_PROVIDER_SITE_OTHER): Payer: Medicare Other | Admitting: Urology

## 2017-11-14 VITALS — BP 143/84 | HR 81 | Ht 71.0 in | Wt >= 6400 oz

## 2017-11-14 DIAGNOSIS — E349 Endocrine disorder, unspecified: Secondary | ICD-10-CM

## 2017-11-14 DIAGNOSIS — N529 Male erectile dysfunction, unspecified: Secondary | ICD-10-CM

## 2017-11-15 LAB — FSH/LH
FSH: 8.6 m[IU]/mL (ref 1.5–12.4)
LH: 7.2 m[IU]/mL (ref 1.7–8.6)

## 2017-11-15 LAB — PROLACTIN: Prolactin: 26.2 ng/mL — ABNORMAL HIGH (ref 4.0–15.2)

## 2017-11-17 ENCOUNTER — Encounter: Payer: Self-pay | Admitting: Urology

## 2017-11-17 ENCOUNTER — Telehealth: Payer: Self-pay | Admitting: Urology

## 2017-11-17 ENCOUNTER — Other Ambulatory Visit: Payer: Self-pay

## 2017-11-17 NOTE — Telephone Encounter (Signed)
Requested labs and they are faxing today  Sharyn Lull

## 2017-11-17 NOTE — Telephone Encounter (Signed)
-----   Message from Abbie Sons, MD sent at 11/16/2017  7:12 AM EDT ----- Pituitary hormones were normal.  Prolactin was slightly elevated at 26.2.  Recommend an endocrinology evaluation.

## 2017-11-17 NOTE — Telephone Encounter (Signed)
LMOM

## 2017-11-17 NOTE — Progress Notes (Signed)
11/14/2017 7:49 AM   Carlyle Lipa 12-21-1960 [id number]  Referring provider: Letta Median, MD [place], [place] [address]  Chief Complaint  Patient presents with  . New Patient (Initial Visit)    HPI: Eric Berg is a 57 year old male referred for evaluation of hypogonadism.  He apparently had 2 morning testosterone levels which were low however I do not have these results.  He has erectile dysfunction and complains of partial erections which are typically not firm enough for penetration.  He will lose erections prior to orgasm.  He also complains of decreased libido.  He does have sleep apnea however was unable to tolerate CPAP.  Organic risk factors include hypertension, antihypertensive medications including beta-blockers, morbid obesity.  He is on chronic anticoagulation for DVT.   PMH: Past Medical History:  Diagnosis Date  . Anxiety   . Asthma   . Depression   . Diabetes mellitus without complication ([place])   . DVT (deep venous thrombosis) ([place]) 04/2016  . Gout   . Hematuria   . Hyperlipidemia   . Hypertension   . Lip cancer   . Morbid (severe) obesity due to excess calories ([place])   . Sleep apnea   . Stasis dermatitis     Surgical History: Past Surgical History:  Procedure Laterality Date  . EYE SURGERY    . lip cancer removed       Home Medications:  Allergies as of 11/14/2017   No Known Allergies     Medication List        Accurate as of 11/14/17 11:59 PM. Always use your most recent med list.          albuterol 108 (90 Base) MCG/ACT inhaler Commonly known as:  PROVENTIL HFA;VENTOLIN HFA Inhale into the lungs.   atenolol 100 MG tablet Commonly known as:  TENORMIN Take 100 mg by mouth daily.   buPROPion 100 MG tablet Commonly known as:  WELLBUTRIN Take 100 mg by mouth 2 (two) times daily.   clobetasol ointment 0.05 % Commonly known as:  TEMOVATE Apply 1 application topically 2 (two) times  daily.   Colchicine 0.6 MG Caps Take 2 tablets by mouth as needed.   desvenlafaxine 100 MG 24 hr tablet Commonly known as:  PRISTIQ Take 100 mg by mouth daily.   furosemide 20 MG tablet Commonly known as:  LASIX Take 20 mg by mouth.   haloperidol 2 MG tablet Commonly known as:  HALDOL Take 2 mg by mouth 2 (two) times daily.   lisdexamfetamine 40 MG capsule Commonly known as:  VYVANSE Take 60 mg by mouth every morning.   VYVANSE 60 MG capsule Generic drug:  lisdexamfetamine   metFORMIN 500 MG tablet Commonly known as:  GLUCOPHAGE Take by mouth 2 (two) times daily with a meal.   quinapril 20 MG tablet Commonly known as:  ACCUPRIL Take 20 mg by mouth at bedtime.   Rivaroxaban 15 & 20 MG Tbpk Take as directed on package: Start with one 15mg  tablet by mouth twice a day with food. On Day 22, switch to one 20mg  tablet once a day with food.   XARELTO 20 MG Tabs tablet Generic drug:  rivaroxaban   traZODone 150 MG tablet Commonly known as:  DESYREL Take by mouth at bedtime.       Allergies: No Known Allergies  Family History: Family History  Problem Relation Age of Onset  . Kidney cancer Neg Hx   . Kidney disease Neg Hx   .  Prostate cancer Neg Hx     Social History:  reports that  has never smoked. he has never used smokeless tobacco. He reports that he does not drink alcohol or use drugs.  ROS: UROLOGY Frequent Urination?: No Hard to postpone urination?: Yes Burning/pain with urination?: No Get up at night to urinate?: Yes Leakage of urine?: Yes Urine stream starts and stops?: No Trouble starting stream?: No Do you have to strain to urinate?: Yes Blood in urine?: No Urinary tract infection?: No Sexually transmitted disease?: No Injury to kidneys or bladder?: No Painful intercourse?: No Weak stream?: Yes Erection problems?: Yes Penile pain?: No  Gastrointestinal Nausea?: No Vomiting?: No Indigestion/heartburn?: No Diarrhea?: No Constipation?:  No  Constitutional Fever: No Night sweats?: No Weight loss?: No Fatigue?: Yes  Skin Skin rash/lesions?: No Itching?: No  Eyes Blurred vision?: No Double vision?: No  Ears/Nose/Throat Sore throat?: No Sinus problems?: No  Hematologic/Lymphatic Swollen glands?: No Easy bruising?: Yes  Cardiovascular Leg swelling?: Yes Chest pain?: No  Respiratory Cough?: No Shortness of breath?: Yes  Endocrine Excessive thirst?: Yes  Musculoskeletal Back pain?: Yes Joint pain?: Yes  Neurological Headaches?: No Dizziness?: No  Psychologic Depression?: Yes Anxiety?: Yes  Physical Exam: BP (!) 143/84   Pulse 81   Ht 5\' 11"  (1.803 m)   Wt (!) 494 lb (224.1 kg)   BMI 68.90 kg/m   Constitutional:  Alert and oriented, No acute distress. HEENT: [place] AT, moist mucus membranes.  Trachea midline, no masses. Cardiovascular: No clubbing, cyanosis, or edema. Respiratory: Normal respiratory effort, no increased work of breathing. GI: Abdomen is soft, nontender, nondistended, no abdominal masses GU: No CVA tenderness.  Penis without lesions, testes descended bilaterally with mild bilateral atrophy. Lymph: No cervical or inguinal lymphadenopathy. Skin: No rashes, bruises or suspicious lesions. Neurologic: Grossly intact, no focal deficits, moving all 4 extremities. Psychiatric: Normal mood and affect.  Laboratory Data: Lab Results  Component Value Date   WBC 11.7 (H) 01/10/2017   HGB 14.9 01/10/2017   HCT 44.2 01/10/2017   MCV 92.0 01/10/2017   PLT 254 01/10/2017    Lab Results  Component Value Date   CREATININE 0.74 01/10/2017    Lab Results  Component Value Date   HGBA1C 5.4 12/13/2007     Assessment & Plan:   57 year old male with hypogonadism.  Recommend checking pituitary hormones and prolactin.  Would not recommend testosterone replacement with untreated sleep apnea.  We discussed PDE 5 inhibitors for his erectile dysfunction however he wanted to hold off on  this class of medications at this time  1. Testosterone deficiency  - FSH/LH - Prolactin   Abbie Sons, MD  [facility] [address] [place], [place] [address] [phone]

## 2017-11-18 NOTE — Telephone Encounter (Signed)
Spoke with pt caregiver in reference to lab results and referral to endocrinology. Caregiver  Voiced understanding.

## 2017-11-29 ENCOUNTER — Encounter: Payer: Self-pay | Admitting: Skilled Nursing Facility1

## 2017-11-29 ENCOUNTER — Encounter: Payer: Medicare Other | Attending: General Surgery | Admitting: Skilled Nursing Facility1

## 2017-11-29 DIAGNOSIS — Z713 Dietary counseling and surveillance: Secondary | ICD-10-CM | POA: Diagnosis not present

## 2017-11-29 DIAGNOSIS — F419 Anxiety disorder, unspecified: Secondary | ICD-10-CM | POA: Insufficient documentation

## 2017-11-29 DIAGNOSIS — I10 Essential (primary) hypertension: Secondary | ICD-10-CM | POA: Insufficient documentation

## 2017-11-29 DIAGNOSIS — E119 Type 2 diabetes mellitus without complications: Secondary | ICD-10-CM | POA: Diagnosis not present

## 2017-11-29 NOTE — Progress Notes (Signed)
6 Months Supervised Weight Loss Visit:   Pre-Operative sleeve gastrectomy Surgery  Medical Nutrition Therapy:  Appt start time: 8676 end time:  1120  Primary concerns today: Supervised Weight Loss Visit. 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 Pt states he will visit his brother in [place] in august.   Goal to be 441 lbs to qualify for surgery  Weight: 488.7 lbs BMI: 68.16  Progress Towards Goal(s):  In progress.  24-hour recall: B: premier protein shake  Pretzels for snack and ice cream L: 2 salami and cheese sandwich----skipped or pizza applesauce  D: 2 hamburgers----steak---footlong from subway or chili and rice S: protein cookie, sugar free chocolate pudding   Handouts given during visit include:  none   Nutritional Diagnosis:  [place]-3.3 Obesity related to past poor dietary habits and physical inactivity as evidenced by patient attending supervised weight loss for insurance approval of bariatric surgery.    Intervention:  Nutrition counseling provided. Dietitian educated the pt on caloric content of foods.  Goals: -get up after each episode ends or when watching basketball commercials; walk to mailbox and back; some physical therapy if not nice outside: keep up the great work! -Get in he pool on tuesdays and thursdays  -Do not get seconds  -Keep working on drinking less soda -Drink water or natures twist with your meals  -Only eat out 1 day a week -Think about how much you are about to eat Visual Auditory Hands on  Barriers to learning/adherence to lifestyle change: suspected learning deficit  Demonstrated degree of understanding via:  Teach Back   Monitoring/Evaluation:  Dietary intake, exercise, and body weight.

## 2017-12-27 ENCOUNTER — Ambulatory Visit: Payer: Medicare Other | Admitting: Skilled Nursing Facility1

## 2018-01-03 ENCOUNTER — Encounter: Payer: Medicare Other | Attending: General Surgery | Admitting: Skilled Nursing Facility1

## 2018-01-03 ENCOUNTER — Encounter: Payer: Self-pay | Admitting: Skilled Nursing Facility1

## 2018-01-03 DIAGNOSIS — Z713 Dietary counseling and surveillance: Secondary | ICD-10-CM | POA: Diagnosis not present

## 2018-01-03 DIAGNOSIS — I10 Essential (primary) hypertension: Secondary | ICD-10-CM | POA: Diagnosis not present

## 2018-01-03 DIAGNOSIS — F419 Anxiety disorder, unspecified: Secondary | ICD-10-CM | POA: Insufficient documentation

## 2018-01-03 DIAGNOSIS — E119 Type 2 diabetes mellitus without complications: Secondary | ICD-10-CM | POA: Diagnosis not present

## 2018-01-03 NOTE — Patient Instructions (Addendum)
-  Walk to the mailbox every day 2 times a day  -Play basketball 1 time every week with your neighbor  -keep going to the pool on thursdays   -Keep working on not getting seconds

## 2018-01-03 NOTE — Progress Notes (Signed)
6 Months Supervised Weight Loss Visit:   Pre-Operative sleeve gastrectomy Surgery  Medical Nutrition Therapy:  Appt start time: 3536 end time:  1120  Primary concerns today: Supervised Weight Loss Visit. 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 Pt states he still drinks soda on the weekend. Pt states he feels he can do better.  Goal to be 441 lbs to qualify for surgery  Weight: 488.7 lbs BMI: 68.16  Progress Towards Goal(s):  In progress.  24-hour recall: B: premier protein shake  Pretzels for snack and ice cream L: 2 salami and cheese sandwich----skipped or pizza applesauce  D: 2 hamburgers----steak---footlong from subway or chili and rice S: protein cookie, sugar free chocolate pudding   Nutritional Diagnosis:  [place]-3.3 Obesity related to past poor dietary habits and physical inactivity as evidenced by patient attending supervised weight loss for insurance approval of bariatric surgery.    Intervention:  Nutrition counseling provided. Dietitian educated the pt on caloric content of foods.  Goals: -Walk to the mailbox every day 2 times a day -Play basketball 1 time every week with your neighbor -keep going to the pool on thursdays  -Keep working on not getting seconds Visual Auditory Hands on  Barriers to learning/adherence to lifestyle change: learning deficit  Demonstrated degree of understanding via:  Teach Back   Monitoring/Evaluation:  Dietary intake, exercise, and body weight.

## 2018-01-31 ENCOUNTER — Encounter: Payer: Self-pay | Admitting: Skilled Nursing Facility1

## 2018-01-31 ENCOUNTER — Encounter: Payer: Medicare Other | Attending: General Surgery | Admitting: Skilled Nursing Facility1

## 2018-01-31 DIAGNOSIS — F419 Anxiety disorder, unspecified: Secondary | ICD-10-CM | POA: Diagnosis not present

## 2018-01-31 DIAGNOSIS — Z713 Dietary counseling and surveillance: Secondary | ICD-10-CM | POA: Insufficient documentation

## 2018-01-31 DIAGNOSIS — E119 Type 2 diabetes mellitus without complications: Secondary | ICD-10-CM | POA: Diagnosis not present

## 2018-01-31 DIAGNOSIS — I10 Essential (primary) hypertension: Secondary | ICD-10-CM | POA: Insufficient documentation

## 2018-01-31 NOTE — Progress Notes (Signed)
6 Months Supervised Weight Loss Visit:   Pre-Operative sleeve gastrectomy Surgery  Medical Nutrition Therapy:  Appt start time: 5176 end time:  1120  Primary concerns today: Supervised Weight Loss Visit. 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 Pt states he still drinks soda on the weekend. Pt states he feels he can do better.   Pt states he had a cold and had to stay out for work. Pt states he has been eating out more this past month. Pt states he has been halving his meat portions. Pt states he still struggles with drinking soda. Pt states he will be seeing his brother August 19th thru the 24th.    Goal to be 441 lbs to qualify for surgery  Weight: 488.7 lbs BMI: 68.16  Progress Towards Goal(s):  In progress.  24-hour recall: B: premier protein shake  Pretzels for snack and ice cream L: 2 salami and cheese sandwich----skipped or pizza applesauce  D: 2 hamburgers----steak---footlong from subway or chili and rice S: protein cookie, sugar free chocolate pudding   Nutritional Diagnosis:  [place]-3.3 Obesity related to past poor dietary habits and physical inactivity as evidenced by patient attending supervised weight loss for insurance approval of bariatric surgery.    Intervention:  Nutrition counseling provided. Dietitian educated the pt on caloric content of foods.  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

## 2018-02-13 ENCOUNTER — Telehealth: Payer: Self-pay | Admitting: Skilled Nursing Facility1

## 2018-02-13 NOTE — Telephone Encounter (Signed)
Pts care giver called stating they will not continue to drive the pt out to [place] and will transfer care to a dietitian in [place].

## 2018-02-21 ENCOUNTER — Ambulatory Visit: Payer: Medicare Other | Admitting: Skilled Nursing Facility1

## 2018-03-07 ENCOUNTER — Ambulatory Visit: Payer: Medicare Other | Admitting: Skilled Nursing Facility1

## 2018-03-30 DIAGNOSIS — R7989 Other specified abnormal findings of blood chemistry: Secondary | ICD-10-CM | POA: Insufficient documentation

## 2018-04-04 ENCOUNTER — Ambulatory Visit: Payer: Medicare Other | Admitting: Skilled Nursing Facility1

## 2018-04-25 ENCOUNTER — Other Ambulatory Visit: Payer: Self-pay | Admitting: "Endocrinology

## 2018-04-25 DIAGNOSIS — R7989 Other specified abnormal findings of blood chemistry: Secondary | ICD-10-CM

## 2018-04-25 DIAGNOSIS — E229 Hyperfunction of pituitary gland, unspecified: Principal | ICD-10-CM

## 2018-05-22 ENCOUNTER — Ambulatory Visit
Admission: RE | Admit: 2018-05-22 | Discharge: 2018-05-22 | Disposition: A | Discharge status: Home / Self Care | Payer: Medicare Other | Source: Ambulatory Visit | Attending: "Endocrinology | Admitting: "Endocrinology

## 2018-05-22 DIAGNOSIS — R7989 Other specified abnormal findings of blood chemistry: Secondary | ICD-10-CM

## 2018-05-22 DIAGNOSIS — E229 Hyperfunction of pituitary gland, unspecified: Principal | ICD-10-CM

## 2018-06-15 ENCOUNTER — Ambulatory Visit
Admission: RE | Admit: 2018-06-15 | Discharge: 2018-06-15 | Disposition: A | Discharge status: Home / Self Care | Payer: Medicare Other | Source: Ambulatory Visit | Attending: "Endocrinology | Admitting: "Endocrinology

## 2018-06-15 DIAGNOSIS — E229 Hyperfunction of pituitary gland, unspecified: Secondary | ICD-10-CM | POA: Diagnosis not present

## 2018-06-15 DIAGNOSIS — D352 Benign neoplasm of pituitary gland: Secondary | ICD-10-CM | POA: Diagnosis not present

## 2018-06-15 MED ORDER — GADOBENATE DIMEGLUMINE 529 MG/ML IV SOLN
10.0000 mL | Freq: Once | INTRAVENOUS | Status: AC | PRN
  Administered 2018-06-15: 10 mL via INTRAVENOUS

## 2018-07-11 DIAGNOSIS — D497 Neoplasm of unspecified behavior of endocrine glands and other parts of nervous system: Secondary | ICD-10-CM | POA: Insufficient documentation

## 2018-08-11 ENCOUNTER — Other Ambulatory Visit: Payer: Self-pay

## 2018-08-11 DIAGNOSIS — K921 Melena: Secondary | ICD-10-CM

## 2018-09-07 ENCOUNTER — Telehealth: Payer: Self-pay

## 2018-09-07 NOTE — Telephone Encounter (Signed)
Received blood thinner request back from Dr. Rebeca Alert.  She has advised patient to stop blood thinner 5 days prior to procedure and resume 1 day after.  These instructions have been given to patient.  Thanks [employer]

## 2018-09-19 ENCOUNTER — Other Ambulatory Visit: Payer: Self-pay

## 2018-09-19 ENCOUNTER — Encounter
Admission: RE | Disposition: A | Discharge status: Home / Self Care | Payer: Self-pay | Source: Home / Self Care | Attending: Gastroenterology

## 2018-09-19 ENCOUNTER — Ambulatory Visit
Admission: RE | Admit: 2018-09-19 | Discharge: 2018-09-19 | Disposition: A | Discharge status: Home / Self Care | Payer: Medicare Other | Attending: Gastroenterology | Admitting: Gastroenterology

## 2018-09-19 ENCOUNTER — Ambulatory Visit: Payer: Medicare Other | Admitting: Anesthesiology

## 2018-09-19 ENCOUNTER — Encounter: Payer: Self-pay | Admitting: *Deleted

## 2018-09-19 DIAGNOSIS — Z5309 Procedure and treatment not carried out because of other contraindication: Secondary | ICD-10-CM | POA: Diagnosis not present

## 2018-09-19 DIAGNOSIS — K921 Melena: Secondary | ICD-10-CM

## 2018-09-19 SURGERY — COLONOSCOPY WITH PROPOFOL
Anesthesia: General

## 2018-09-19 MED ORDER — SODIUM CHLORIDE 0.9 % IV SOLN: INTRAVENOUS | Status: DC

## 2018-09-19 NOTE — Progress Notes (Signed)
Spoke with pt and rescheduled his colonoscopy. I have explained the importance of following the diet prep properly to ensure that pt has a clean colon for the procedure.

## 2018-09-19 NOTE — Anesthesia Preprocedure Evaluation (Deleted)
Anesthesia Evaluation  Patient identified by MRN, date of birth, ID band Patient awake    Reviewed: Allergy & Precautions, H&P , NPO status , Patient's Chart, lab work & pertinent test results, reviewed documented beta blocker date and time   Airway Mallampati: II   Neck ROM: full    Dental  (+) Poor Dentition   Pulmonary neg pulmonary ROS, asthma , sleep apnea ,    Pulmonary exam normal        Cardiovascular Exercise Tolerance: Poor hypertension, On Medications negative cardio ROS Normal cardiovascular exam Rhythm:regular Rate:Normal     Neuro/Psych PSYCHIATRIC DISORDERS Anxiety Depression negative neurological ROS  negative psych ROS   GI/Hepatic negative GI ROS, Neg liver ROS,   Endo/Other  diabetes, Well Controlled, Type 2, Oral Hypoglycemic AgentsMorbid obesity  Renal/GU negative Renal ROS  negative genitourinary   Musculoskeletal   Abdominal   Peds  Hematology negative hematology ROS (+)   Anesthesia Other Findings Past Medical History: No date: Anxiety No date: Asthma No date: Depression No date: Diabetes mellitus without complication ([place]) 29/9371: DVT (deep venous thrombosis) ([facility]) No date: Gout No date: Hematuria No date: Hyperlipidemia No date: Hypertension 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 No date: Morbid (severe) obesity due to excess calories ([facility]) No date: Sleep apnea No date: Stasis dermatitis Past Surgical History: No date: EYE SURGERY No date: lip cancer removed    Reproductive/Obstetrics negative OB ROS                            Anesthesia Physical Anesthesia Plan  ASA: III  Anesthesia Plan: General   Post-op Pain Management:    Induction:   PONV Risk Score and Plan:   Airway Management Planned:   Additional Equipment:   Intra-op Plan:   Post-operative Plan:   Informed Consent: I have reviewed the patients History and Physical, chart, labs and discussed  the procedure including the risks, benefits and alternatives for the proposed anesthesia with the patient or authorized representative who has indicated his/her understanding and acceptance.   Dental Advisory Given  Plan Discussed with: CRNA  Anesthesia Plan Comments:         Anesthesia Quick Evaluation

## 2018-09-25 ENCOUNTER — Telehealth: Payer: Self-pay

## 2018-09-25 NOTE — Telephone Encounter (Signed)
Dr. Rebeca Alert,   Patient contacted the office stated he forgot to stop his blood thinner and stopped it today instead.  You advised him to stop it 5 days prior to procedure.  Are you okay with him to still have his colonoscopy as scheduled for 09/28/18.  Please advise.  (Faxed manually to Dr. Rebeca Alert [phone])  Thanks Sharyn Lull

## 2018-09-28 ENCOUNTER — Encounter
Admission: RE | Disposition: A | Discharge status: Home / Self Care | Payer: Self-pay | Source: Home / Self Care | Attending: Gastroenterology

## 2018-09-28 ENCOUNTER — Other Ambulatory Visit: Payer: Self-pay

## 2018-09-28 ENCOUNTER — Ambulatory Visit
Admission: RE | Admit: 2018-09-28 | Discharge: 2018-09-28 | Disposition: A | Discharge status: Home / Self Care | Payer: Medicare Other | Attending: Gastroenterology | Admitting: Gastroenterology

## 2018-09-28 ENCOUNTER — Encounter: Payer: Self-pay | Admitting: Anesthesiology

## 2018-09-28 ENCOUNTER — Telehealth: Payer: Self-pay

## 2018-09-28 DIAGNOSIS — K921 Melena: Secondary | ICD-10-CM

## 2018-09-28 DIAGNOSIS — Z539 Procedure and treatment not carried out, unspecified reason: Secondary | ICD-10-CM | POA: Insufficient documentation

## 2018-09-28 SURGERY — COLONOSCOPY WITH PROPOFOL
Anesthesia: General

## 2018-09-28 MED ORDER — SODIUM CHLORIDE 0.9 % IV SOLN: INTRAVENOUS | Status: DC

## 2018-09-28 MED ORDER — NA SULFATE-K SULFATE-MG SULF 17.5-3.13-1.6 GM/177ML PO SOLN: 1.0000 | Freq: Once | ORAL | 0 refills | Status: AC

## 2018-09-28 NOTE — Anesthesia Preprocedure Evaluation (Deleted)
Anesthesia Evaluation  Patient identified by MRN, date of birth, ID band Patient awake    Reviewed: Allergy & Precautions, H&P , NPO status , Patient's Chart, lab work & pertinent test results  Airway        Dental   Pulmonary asthma , sleep apnea ,           Cardiovascular hypertension, negative cardio ROS       Neuro/Psych PSYCHIATRIC DISORDERS Anxiety Depression negative neurological ROS     GI/Hepatic negative GI ROS, Neg liver ROS,   Endo/Other  diabetesMorbid obesity  Renal/GU negative Renal ROS  negative genitourinary   Musculoskeletal   Abdominal   Peds  Hematology negative hematology ROS (+)   Anesthesia Other Findings Past Medical History: No date: Anxiety No date: Asthma No date: Depression No date: Diabetes mellitus without complication ([place]) 40/9811: DVT (deep venous thrombosis) ([facility]) No date: Gout No date: Hematuria No date: Hyperlipidemia No date: Hypertension 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 No date: Morbid (severe) obesity due to excess calories ([facility]) No date: Sleep apnea No date: Stasis dermatitis  Past Surgical History: No date: EYE SURGERY No date: lip cancer removed      Reproductive/Obstetrics negative OB ROS                             Anesthesia Physical Anesthesia Plan  ASA: III  Anesthesia Plan: General   Post-op Pain Management:    Induction:   PONV Risk Score and Plan: Propofol infusion and TIVA  Airway Management Planned:   Additional Equipment:   Intra-op Plan:   Post-operative Plan:   Informed Consent: I have reviewed the patients History and Physical, chart, labs and discussed the procedure including the risks, benefits and alternatives for the proposed anesthesia with the patient or authorized representative who has indicated his/her understanding and acceptance.     Dental Advisory Given  Plan Discussed with: Anesthesiologist and  CRNA  Anesthesia Plan Comments:         Anesthesia Quick Evaluation

## 2018-09-28 NOTE — Telephone Encounter (Signed)
Santiago Glad, patients family member contacted office to reschedule colonoscopy due to blood thinner not stopped.  Patient has been rescheduled to 11/02/18 with Dr. Vicente Males at [facility].  New instructions have been prepared.  I've advised Santiago Glad that Xarelto needs to be stopped on 10/28/18 as previously advised by Dr. Rebeca Alert and to resume on 11/03/18.  Thanks [employer]

## 2018-11-01 ENCOUNTER — Encounter: Payer: Self-pay | Admitting: *Deleted

## 2018-11-02 ENCOUNTER — Ambulatory Visit: Payer: Medicare Other | Admitting: Anesthesiology

## 2018-11-02 ENCOUNTER — Encounter
Admission: RE | Disposition: A | Discharge status: Home / Self Care | Payer: Self-pay | Source: Home / Self Care | Attending: Gastroenterology

## 2018-11-02 ENCOUNTER — Ambulatory Visit
Admission: RE | Admit: 2018-11-02 | Discharge: 2018-11-02 | Disposition: A | Discharge status: Home / Self Care | Payer: Medicare Other | Attending: Gastroenterology | Admitting: Gastroenterology

## 2018-11-02 ENCOUNTER — Encounter: Payer: Self-pay | Admitting: *Deleted

## 2018-11-02 DIAGNOSIS — D122 Benign neoplasm of ascending colon: Secondary | ICD-10-CM | POA: Diagnosis not present

## 2018-11-02 DIAGNOSIS — D120 Benign neoplasm of cecum: Secondary | ICD-10-CM | POA: Insufficient documentation

## 2018-11-02 DIAGNOSIS — Z7901 Long term (current) use of anticoagulants: Secondary | ICD-10-CM | POA: Insufficient documentation

## 2018-11-02 DIAGNOSIS — M109 Gout, unspecified: Secondary | ICD-10-CM | POA: Diagnosis not present

## 2018-11-02 DIAGNOSIS — I10 Essential (primary) hypertension: Secondary | ICD-10-CM | POA: Insufficient documentation

## 2018-11-02 DIAGNOSIS — F419 Anxiety disorder, unspecified: Secondary | ICD-10-CM | POA: Diagnosis not present

## 2018-11-02 DIAGNOSIS — K640 First degree hemorrhoids: Secondary | ICD-10-CM | POA: Diagnosis not present

## 2018-11-02 DIAGNOSIS — G4730 Sleep apnea, unspecified: Secondary | ICD-10-CM | POA: Diagnosis not present

## 2018-11-02 DIAGNOSIS — Z79899 Other long term (current) drug therapy: Secondary | ICD-10-CM | POA: Insufficient documentation

## 2018-11-02 DIAGNOSIS — Z86718 Personal history of other venous thrombosis and embolism: Secondary | ICD-10-CM | POA: Diagnosis not present

## 2018-11-02 DIAGNOSIS — K921 Melena: Secondary | ICD-10-CM

## 2018-11-02 DIAGNOSIS — D124 Benign neoplasm of descending colon: Secondary | ICD-10-CM | POA: Insufficient documentation

## 2018-11-02 DIAGNOSIS — Z6841 Body Mass Index (BMI) 40.0 and over, adult: Secondary | ICD-10-CM | POA: Insufficient documentation

## 2018-11-02 DIAGNOSIS — F329 Major depressive disorder, single episode, unspecified: Secondary | ICD-10-CM | POA: Insufficient documentation

## 2018-11-02 DIAGNOSIS — E785 Hyperlipidemia, unspecified: Secondary | ICD-10-CM | POA: Diagnosis not present

## 2018-11-02 DIAGNOSIS — K625 Hemorrhage of anus and rectum: Secondary | ICD-10-CM | POA: Insufficient documentation

## 2018-11-02 DIAGNOSIS — E1165 Type 2 diabetes mellitus with hyperglycemia: Secondary | ICD-10-CM | POA: Insufficient documentation

## 2018-11-02 DIAGNOSIS — D123 Benign neoplasm of transverse colon: Secondary | ICD-10-CM | POA: Diagnosis not present

## 2018-11-02 DIAGNOSIS — J45909 Unspecified asthma, uncomplicated: Secondary | ICD-10-CM | POA: Insufficient documentation

## 2018-11-02 DIAGNOSIS — Z7984 Long term (current) use of oral hypoglycemic drugs: Secondary | ICD-10-CM | POA: Diagnosis not present

## 2018-11-02 HISTORY — PX: COLONOSCOPY WITH PROPOFOL: SHX5780

## 2018-11-02 SURGERY — COLONOSCOPY WITH PROPOFOL
Anesthesia: General

## 2018-11-02 MED ORDER — PROPOFOL 10 MG/ML IV BOLUS
INTRAVENOUS | Status: DC | PRN
  Administered 2018-11-02: 50 mg via INTRAVENOUS
  Administered 2018-11-02: 100 mg via INTRAVENOUS

## 2018-11-02 MED ORDER — LIDOCAINE HCL (PF) 2 % IJ SOLN
INTRAMUSCULAR | Status: AC
  Filled 2018-11-02: qty 10

## 2018-11-02 MED ORDER — PROPOFOL 500 MG/50ML IV EMUL
INTRAVENOUS | Status: AC
  Filled 2018-11-02: qty 50

## 2018-11-02 MED ORDER — GLYCOPYRROLATE 0.2 MG/ML IJ SOLN
INTRAMUSCULAR | Status: AC
  Filled 2018-11-02: qty 1

## 2018-11-02 MED ORDER — SODIUM CHLORIDE 0.9 % IV SOLN
INTRAVENOUS | Status: DC
  Administered 2018-11-02: 08:00:00 via INTRAVENOUS

## 2018-11-02 MED ORDER — GLYCOPYRROLATE 0.2 MG/ML IJ SOLN
INTRAMUSCULAR | Status: DC | PRN
  Administered 2018-11-02: 0.1 mg via INTRAVENOUS

## 2018-11-02 MED ORDER — PROPOFOL 500 MG/50ML IV EMUL
INTRAVENOUS | Status: DC | PRN
  Administered 2018-11-02: 100 ug/kg/min via INTRAVENOUS

## 2018-11-02 MED ORDER — LIDOCAINE HCL (CARDIAC) PF 100 MG/5ML IV SOSY
PREFILLED_SYRINGE | INTRAVENOUS | Status: DC | PRN
  Administered 2018-11-02: 50 mg via INTRATRACHEAL

## 2018-11-02 NOTE — Transfer of Care (Signed)
Immediate Anesthesia Transfer of Care Note  Patient: Eric Berg  Procedure(s) Performed: COLONOSCOPY WITH PROPOFOL (N/A )  Patient Location: PACU and Endoscopy Unit  Anesthesia Type:General  Level of Consciousness: awake  Airway & Oxygen Therapy: Patient Spontanous Breathing  Post-op Assessment: Report given to RN and Post -op Vital signs reviewed and stable  Post vital signs: Reviewed and stable  Last Vitals:  Vitals Value Taken Time  BP 109/73 11/02/2018  8:57 AM  Temp 36.3 C 11/02/2018  8:55 AM  Pulse 80 11/02/2018  8:58 AM  Resp 17 11/02/2018  8:58 AM  SpO2 96 % 11/02/2018  8:58 AM  Vitals shown include unvalidated device data.  Last Pain:  Vitals:   11/02/18 0855  TempSrc: Tympanic  PainSc: 0-No pain         Complications: No apparent anesthesia complications

## 2018-11-02 NOTE — Anesthesia Post-op Follow-up Note (Signed)
Anesthesia QCDR form completed.        

## 2018-11-02 NOTE — Op Note (Signed)
[facility] Gastroenterology Patient Name: Eric Berg Procedure Date: 11/02/2018 8:17 AM MRN: [id number] Account #: [id number] Date of Birth: 1960-10-21 Admit Type: Outpatient Age: 58 Room: [facility] ENDO ROOM 4 Gender: Male Note Status: Finalized Procedure:            Colonoscopy Indications:          Rectal bleeding Providers:            [name] MD, MD Referring MD:         Baxter Kail. Rebeca Alert MD, MD (Referring MD) Medicines:            Monitored Anesthesia Care Complications:        No immediate complications. Procedure:            Pre-Anesthesia Assessment:                       - Prior to the procedure, a History and Physical was                        performed, and patient medications, allergies and                        sensitivities were reviewed. The patient's tolerance of                        previous anesthesia was reviewed.                       - The risks and benefits of the procedure and the                        sedation options and risks were discussed with the                        patient. All questions were answered and informed                        consent was obtained.                       - ASA Grade Assessment: III - A patient with severe                        systemic disease.                       After obtaining informed consent, the colonoscope was                        passed under direct vision. Throughout the procedure,                        the patient's blood pressure, pulse, and oxygen                        saturations were monitored continuously. The                        Colonoscope was introduced through the anus and  advanced to the the cecum, identified by the                        appendiceal orifice, IC valve and transillumination.                        The colonoscopy was performed with ease. The patient                        tolerated the procedure well. The quality of the bowel                        preparation was good. Findings:      The perianal and digital rectal examinations were normal.      Non-bleeding internal hemorrhoids were found during retroflexion. The       hemorrhoids were small and Grade I (internal hemorrhoids that do not       prolapse).      Five sessile polyps were found in the proximal ascending colon and       cecum. The polyps were 5 to 8 mm in size. These polyps were removed with       a cold snare. Resection and retrieval were complete. To prevent bleeding       after the polypectomy, one hemostatic clip was successfully placed.       There was no bleeding during, or at the end, of the procedure.      Two sessile polyps were found in the transverse colon. The polyps were 4       to 6 mm in size. These polyps were removed with a cold snare. Resection       and retrieval were complete.      Two sessile polyps were found in the descending colon. The polyps were 4       to 6 mm in size. These polyps were removed with a cold snare. Resection       and retrieval were complete. To prevent bleeding after the polypectomy,       one hemostatic clip was successfully placed. There was no bleeding at       the end of the procedure.      The exam was otherwise without abnormality on direct and retroflexion       views. Impression:           - Non-bleeding internal hemorrhoids.                       - Five 5 to 8 mm polyps in the proximal ascending colon                        and in the cecum, removed with a cold snare. Resected                        and retrieved. Clip was placed.                       - Two 4 to 6 mm polyps in the transverse colon, removed                        with a cold snare. Resected and retrieved.                       -  Two 4 to 6 mm polyps in the descending colon, removed                        with a cold snare. Resected and retrieved. Clip was                        placed.                       - The examination was  otherwise normal on direct and                        retroflexion views. Recommendation:       - Discharge patient to home (with escort).                       - Resume previous diet.                       - Continue present medications.                       - Await pathology results.                       - Repeat colonoscopy in 3 years for surveillance. Procedure Code(s):    --- Professional ---                       [phone], Colonoscopy, flexible; with removal of tumor(s),                        polyp(s), or other lesion(s) by snare technique Diagnosis Code(s):    --- Professional ---                       D12.2, Benign neoplasm of ascending colon                       D12.0, Benign neoplasm of cecum                       D12.3, Benign neoplasm of transverse colon (hepatic                        flexure or splenic flexure)                       D12.4, Benign neoplasm of descending colon                       K64.0, First degree hemorrhoids                       K62.5, Hemorrhage of anus and rectum CPT copyright 2018 American Medical Association. All rights reserved. The codes documented in this report are preliminary and upon coder review may  be revised to meet current compliance requirements. [name], MD [name] MD, MD 11/02/2018 8:55:58 AM This report has been signed electronically. Number of Addenda: 0 Note Initiated On: 11/02/2018 8:17 AM Scope Withdrawal Time: 0 hours 12 minutes 57 seconds  Total Procedure Duration: 0 hours 27 minutes 26 seconds       [facility]

## 2018-11-02 NOTE — Anesthesia Preprocedure Evaluation (Signed)
Anesthesia Evaluation  Patient identified by MRN, date of birth, ID band Patient awake    Reviewed: Allergy & Precautions, H&P , NPO status , Patient's Chart, lab work & pertinent test results, reviewed documented beta blocker date and time   Airway Mallampati: II   Neck ROM: full    Dental  (+) Poor Dentition, Teeth Intact   Pulmonary asthma , sleep apnea and Continuous Positive Airway Pressure Ventilation ,    Pulmonary exam normal        Cardiovascular Exercise Tolerance: Poor hypertension, On Medications negative cardio ROS Normal cardiovascular exam Rhythm:regular Rate:Normal     Neuro/Psych PSYCHIATRIC DISORDERS Anxiety Depression negative neurological ROS     GI/Hepatic negative GI ROS, Neg liver ROS,   Endo/Other  diabetes, Poorly Controlled, Type 2, Oral Hypoglycemic AgentsMorbid obesity  Renal/GU negative Renal ROS  negative genitourinary   Musculoskeletal   Abdominal   Peds  Hematology negative hematology ROS (+)   Anesthesia Other Findings Past Medical History: No date: Anxiety No date: Asthma No date: Depression No date: Diabetes mellitus without complication ([place]) 74/1638: DVT (deep venous thrombosis) (HCC) No date: Gout No date: Hematuria No date: Hyperlipidemia No date: Hypertension 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 No date: Morbid (severe) obesity due to excess calories (H[facility]) No date: Sleep apnea No date: Stasis dermatitis Past Surgical History: No date: EYE SURGERY No date: lip cancer removed  BMI    Body Mass Index:  61.79 kg/m     Reproductive/Obstetrics negative OB ROS                             Anesthesia Physical Anesthesia Plan  ASA: IV  Anesthesia Plan: General   Post-op Pain Management:    Induction:   PONV Risk Score and Plan:   Airway Management Planned:   Additional Equipment:   Intra-op Plan:   Post-operative Plan:   Informed Consent: I  have reviewed the patients History and Physical, chart, labs and discussed the procedure including the risks, benefits and alternatives for the proposed anesthesia with the patient or authorized representative who has indicated his/her understanding and acceptance.     Dental Advisory Given  Plan Discussed with: CRNA  Anesthesia Plan Comments:         Anesthesia Quick Evaluation

## 2018-11-02 NOTE — H&P (Signed)
[name], MD [address], [place], [place], [place], [address] [address], [place], [place], [place], [address] Phone: [phone]  Fax: [phone]  Primary Care Physician:  Letta Median, MD   Pre-Procedure History & Physical: HPI:  [name] is a 58 y.o. male is here for an colonoscopy.   Past Medical History:  Diagnosis Date  . Anxiety   . Asthma   . Depression   . DVT (deep venous thrombosis) ([place]) 04/2016  . Gout   . Hematuria   . Hyperlipidemia   . Hypertension   . Lip cancer   . Morbid (severe) obesity due to excess calories ([place])   . Sleep apnea   . Stasis dermatitis     Past Surgical History:  Procedure Laterality Date  . EYE SURGERY    . lip cancer removed       Prior to Admission medications   Medication Sig Start Date End Date Taking? Authorizing Provider  warfarin (COUMADIN) 5 MG tablet Take 5 mg by mouth daily.   Yes [provider]  albuterol (PROVENTIL HFA;VENTOLIN HFA) 108 (90 Base) MCG/ACT inhaler Inhale into the lungs.    [provider]  atenolol (TENORMIN) 100 MG tablet Take 100 mg by mouth daily.    [provider]  buPROPion (WELLBUTRIN) 100 MG tablet Take 100 mg by mouth 2 (two) times daily.    [provider]  clobetasol ointment (TEMOVATE) 6.19 % Apply 1 application topically 2 (two) times daily.    [provider]  Colchicine 0.6 MG CAPS Take 2 tablets by mouth as needed.    [provider]  desvenlafaxine (PRISTIQ) 100 MG 24 hr tablet Take 100 mg by mouth daily.    [provider]  furosemide (LASIX) 20 MG tablet Take 20 mg by mouth.    [provider]  haloperidol (HALDOL) 2 MG tablet Take 2 mg by mouth 2 (two) times daily.    [provider]  lisdexamfetamine (VYVANSE) 40 MG capsule Take 60 mg by mouth every morning.     [provider]  metFORMIN (GLUCOPHAGE) 500 MG tablet Take by mouth 2 (two) times daily with a  meal.    [provider]  quinapril (ACCUPRIL) 20 MG tablet Take 20 mg by mouth at bedtime.    [provider]  Rivaroxaban 15 & 20 MG TBPK Take as directed on package: Start with one 15mg  tablet by mouth twice a day with food. On Day 22, switch to one 20mg  tablet once a day with food. 04/30/16   Schuyler Amor, MD  traZODone (DESYREL) 150 MG tablet Take by mouth at bedtime.    [provider]  VYVANSE 60 MG capsule  12/15/16   [provider]  XARELTO 20 MG TABS tablet  12/10/16   [provider]    Allergies as of 09/28/2018  . (No Known Allergies)    Family History  Problem Relation Age of Onset  . Kidney cancer Neg Hx   . Kidney disease Neg Hx   . Prostate cancer Neg Hx     Social History   Socioeconomic History  . Marital status: Married    Spouse name: Not on file  . Number of children: Not on file  . Years of education: Not on file  . Highest education level: Not on file  Occupational History  . Not on file  Social Needs  . Financial resource strain: Not on file  .  Food insecurity:    Worry: Not on file    Inability: Not on file  . Transportation needs:    Medical: Not on file    Non-medical: Not on file  Tobacco Use  . Smoking status: Never Smoker  . Smokeless tobacco: Never Used  Substance and Sexual Activity  . Alcohol use: No  . Drug use: No  . Sexual activity: Not on file  Lifestyle  . Physical activity:    Days per week: Not on file    Minutes per session: Not on file  . Stress: Not on file  Relationships  . Social connections:    Talks on phone: Not on file    Gets together: Not on file    Attends religious service: Not on file    Active member of club or organization: Not on file    Attends meetings of clubs or organizations: Not on file    Relationship status: Not on file  . Intimate partner violence:    Fear of current or ex partner: Not on file    Emotionally abused: Not on file    Physically  abused: Not on file    Forced sexual activity: Not on file  Other Topics Concern  . Not on file  Social History Narrative  . Not on file    Review of Systems: See HPI, otherwise negative ROS  Physical Exam: BP (!) 142/99   Pulse 84   Temp (!) 97.4 F (36.3 C) (Tympanic)   Resp (!) 22   Ht 5\' 11"  (1.803 m)   Wt (!) 200.9 kg   SpO2 94%   BMI 61.79 kg/m  General:   Alert,  pleasant and cooperative in NAD Head:  Normocephalic and atraumatic. Neck:  Supple; no masses or thyromegaly. Lungs:  Clear throughout to auscultation, normal respiratory effort.    Heart:  +S1, +S2, Regular rate and rhythm, No edema. Abdomen:  Soft, nontender and nondistended. Normal bowel sounds, without guarding, and without rebound.   Neurologic:  Alert and  oriented x4;  grossly normal neurologically.  Impression/Plan: Eric Berg is here for an colonoscopy to be performed for blood in stool.  Risks, benefits, limitations, and alternatives regarding  colonoscopy have been reviewed with the patient.  Questions have been answered.  All parties agreeable.   [name], MD  11/02/2018, 8:18 AM

## 2018-11-03 ENCOUNTER — Encounter: Payer: Self-pay | Admitting: Gastroenterology

## 2018-11-03 LAB — SURGICAL PATHOLOGY

## 2018-11-06 ENCOUNTER — Encounter: Payer: Self-pay | Admitting: Gastroenterology

## 2018-11-06 NOTE — Anesthesia Postprocedure Evaluation (Signed)
Anesthesia Post Note  Patient: Eric Berg  Procedure(s) Performed: COLONOSCOPY WITH PROPOFOL (N/A )  Patient location during evaluation: Endoscopy Anesthesia Type: General Level of consciousness: awake and alert and oriented Pain management: pain level controlled Vital Signs Assessment: post-procedure vital signs reviewed and stable Respiratory status: spontaneous breathing Cardiovascular status: blood pressure returned to baseline Anesthetic complications: no     Last Vitals:  Vitals:   11/02/18 0915 11/02/18 0925  BP: 119/80 114/61  Pulse: 65 68  Resp: 19 20  Temp:    SpO2: 94% 96%    Last Pain:  Vitals:   11/03/18 0733  TempSrc:   PainSc: 0-No pain                 Rogerio Boutelle

## 2019-02-21 ENCOUNTER — Other Ambulatory Visit: Payer: Self-pay | Admitting: Physician Assistant

## 2019-02-21 DIAGNOSIS — M7989 Other specified soft tissue disorders: Secondary | ICD-10-CM

## 2019-02-22 ENCOUNTER — Other Ambulatory Visit: Payer: Self-pay

## 2019-02-22 ENCOUNTER — Ambulatory Visit
Admission: RE | Admit: 2019-02-22 | Discharge: 2019-02-22 | Disposition: A | Discharge status: Home / Self Care | Payer: Medicare Other | Source: Ambulatory Visit | Attending: Physician Assistant | Admitting: Physician Assistant

## 2019-02-22 DIAGNOSIS — M7989 Other specified soft tissue disorders: Secondary | ICD-10-CM | POA: Diagnosis present

## 2019-04-26 ENCOUNTER — Other Ambulatory Visit: Payer: Self-pay

## 2019-04-26 ENCOUNTER — Ambulatory Visit
Admission: RE | Admit: 2019-04-26 | Discharge: 2019-04-26 | Disposition: A | Discharge status: Home / Self Care | Payer: Medicare Other | Source: Ambulatory Visit | Attending: Family Medicine | Admitting: Family Medicine

## 2019-04-26 ENCOUNTER — Other Ambulatory Visit: Payer: Self-pay | Admitting: Family Medicine

## 2019-04-26 DIAGNOSIS — M7989 Other specified soft tissue disorders: Secondary | ICD-10-CM | POA: Insufficient documentation

## 2019-06-11 ENCOUNTER — Other Ambulatory Visit: Payer: Self-pay

## 2019-06-12 ENCOUNTER — Other Ambulatory Visit: Payer: Self-pay

## 2019-06-12 ENCOUNTER — Encounter (INDEPENDENT_AMBULATORY_CARE_PROVIDER_SITE_OTHER): Payer: Self-pay

## 2019-06-12 ENCOUNTER — Inpatient Hospital Stay: Payer: Medicare Other | Attending: Oncology | Admitting: Oncology

## 2019-06-12 ENCOUNTER — Inpatient Hospital Stay: Payer: Medicare Other

## 2019-06-12 ENCOUNTER — Encounter: Payer: Self-pay | Admitting: Oncology

## 2019-06-12 VITALS — BP 113/77 | HR 2 | Temp 97.2°F | Resp 18 | Ht 71.0 in | Wt >= 6400 oz

## 2019-06-12 DIAGNOSIS — Z86718 Personal history of other venous thrombosis and embolism: Secondary | ICD-10-CM

## 2019-06-12 DIAGNOSIS — E669 Obesity, unspecified: Secondary | ICD-10-CM | POA: Insufficient documentation

## 2019-06-12 DIAGNOSIS — Z803 Family history of malignant neoplasm of breast: Secondary | ICD-10-CM | POA: Insufficient documentation

## 2019-06-12 DIAGNOSIS — Z79899 Other long term (current) drug therapy: Secondary | ICD-10-CM | POA: Diagnosis not present

## 2019-06-12 DIAGNOSIS — F418 Other specified anxiety disorders: Secondary | ICD-10-CM | POA: Insufficient documentation

## 2019-06-12 DIAGNOSIS — G4730 Sleep apnea, unspecified: Secondary | ICD-10-CM | POA: Insufficient documentation

## 2019-06-12 DIAGNOSIS — I10 Essential (primary) hypertension: Secondary | ICD-10-CM | POA: Diagnosis not present

## 2019-06-12 DIAGNOSIS — E785 Hyperlipidemia, unspecified: Secondary | ICD-10-CM | POA: Insufficient documentation

## 2019-06-12 DIAGNOSIS — Z7901 Long term (current) use of anticoagulants: Secondary | ICD-10-CM | POA: Insufficient documentation

## 2019-06-12 LAB — COMPREHENSIVE METABOLIC PANEL
ALT: 17 U/L (ref 0–44)
AST: 18 U/L (ref 15–41)
Albumin: 3.7 g/dL (ref 3.5–5.0)
Alkaline Phosphatase: 55 U/L (ref 38–126)
Anion gap: 7 (ref 5–15)
BUN: 17 mg/dL (ref 6–20)
CO2: 26 mmol/L (ref 22–32)
Calcium: 9.4 mg/dL (ref 8.9–10.3)
Chloride: 106 mmol/L (ref 98–111)
Creatinine, Ser: 0.68 mg/dL (ref 0.61–1.24)
GFR calc Af Amer: >60 mL/min (ref >60)
GFR calc non Af Amer: >60 mL/min (ref >60)
Glucose, Bld: 84 mg/dL (ref 70–99)
Potassium: 4.1 mmol/L (ref 3.5–5.1)
Sodium: 139 mmol/L (ref 135–145)
Total Bilirubin: 0.4 mg/dL (ref 0.3–1.2)
Total Protein: 7.8 g/dL (ref 6.5–8.1)

## 2019-06-12 LAB — CBC WITH DIFFERENTIAL/PLATELET
Abs Immature Granulocytes: 0.01 10*3/uL (ref 0.00–0.07)
Basophils Absolute: 0.1 10*3/uL (ref 0.0–0.1)
Basophils Relative: 1 %
Eosinophils Absolute: 0.3 10*3/uL (ref 0.0–0.5)
Eosinophils Relative: 4 %
HCT: 41.2 % (ref 39.0–52.0)
Hemoglobin: 13.8 g/dL (ref 13.0–17.0)
Immature Granulocytes: 0 %
Lymphocytes Relative: 38 %
Lymphs Abs: 2.7 10*3/uL (ref 0.7–4.0)
MCH: 30.9 pg (ref 26.0–34.0)
MCHC: 33.5 g/dL (ref 30.0–36.0)
MCV: 92.2 fL (ref 80.0–100.0)
Monocytes Absolute: 0.5 10*3/uL (ref 0.1–1.0)
Monocytes Relative: 6 %
Neutro Abs: 3.7 10*3/uL (ref 1.7–7.7)
Neutrophils Relative %: 51 %
Platelets: 226 10*3/uL (ref 150–400)
RBC: 4.47 MIL/uL (ref 4.22–5.81)
RDW: 14.1 % (ref 11.5–15.5)
WBC: 7.1 10*3/uL (ref 4.0–10.5)
nRBC: 0 % (ref 0.0–0.2)

## 2019-06-12 NOTE — Progress Notes (Signed)
Hematology/Oncology Consult note [facility]  Telephone:([phone] [phone]  Patient Care Team: Letta Median, MD as PCP - General (Family Medicine)   Name of the patient: Eric Berg  [id number]  09-Jun-1961   Date of visit: 06/12/19  Diagnosis-history of right lower extremity DVT  Chief complaint/ Reason for visit-anticoagulation management  Heme/Onc history: Patient is a 58 year old morbidly obese patient with history of right proximal extremity DVT in August 2017.  At that time he was on Xarelto.  He was seen by me in the hospital and given that his weight was 455 pounds I had recommended that he should be switched to Coumadin at that point given that data for newer anticoagulants in morbidly obese patients was limited.  I did recommend indefinite anticoagulation for him.  Patient has been referred to [place] for anticoagulation management.  Patient reports that he is currently on Coumadin and his INR levels fluctuate.  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  No acute DVT.  This was repeated in August 2020 as well which showed the same findings. There was concern for rectal bleeding in February 2020 for which she underwent colonoscopy which showed multiple polyps that were negative for malignancy.  Repeat colonoscopy was recommended in 3 years.  Interval history-patient currently reports being compliant with Coumadin.  Denies any bleeding or bruising at this time.  ECOG PS- 1 Pain scale- 0   Review of systems- Review of Systems  Constitutional: Negative for chills, fever, malaise/fatigue and weight loss.  HENT: Negative for congestion, ear discharge and nosebleeds.   Eyes: Negative for blurred vision.  Respiratory: Negative for cough, hemoptysis, sputum production, shortness of breath and wheezing.   Cardiovascular: Negative for  chest pain, palpitations, orthopnea and claudication.  Gastrointestinal: Negative for abdominal pain, blood in stool, constipation, diarrhea, heartburn, melena, nausea and vomiting.  Genitourinary: Negative for dysuria, flank pain, frequency, hematuria and urgency.  Musculoskeletal: Negative for back pain, joint pain and myalgias.  Skin: Negative for rash.  Neurological: Negative for dizziness, tingling, focal weakness, seizures, weakness and headaches.  Endo/Heme/Allergies: Does not bruise/bleed easily.  Psychiatric/Behavioral: Negative for depression and suicidal ideas. The patient does not have insomnia.       No Known Allergies   Past Medical History:  Diagnosis Date  . Anxiety   . Asthma   . Depression   . DVT (deep venous thrombosis) ([place]) 04/2016  . Gout   . Hematuria   . Hyperlipidemia   . Hypertension   . Lip cancer   . Morbid (severe) obesity due to excess calories ([place])   . Sleep apnea   . Stasis dermatitis      Past Surgical History:  Procedure Laterality Date  . COLONOSCOPY WITH PROPOFOL N/A 11/02/2018   Procedure: COLONOSCOPY WITH PROPOFOL;  Surgeon: [name], MD;  Location: [facility] ENDOSCOPY;  Service: Gastroenterology;  Laterality: N/A;  . EYE SURGERY    . lip cancer removed       Social History   Socioeconomic History  . Marital status: Married    Spouse name: Not on file  . Number of children: Not on file  . Years of education: Not on file  . Highest education level: Not on file  Occupational History  . Not on file  Social Needs  . Financial resource strain: Not on file  . Food insecurity    Worry:  Not on file    Inability: Not on file  . Transportation needs    Medical: Not on file    Non-medical: Not on file  Tobacco Use  . Smoking status: Never Smoker  . Smokeless tobacco: Never Used  Substance and Sexual Activity  . Alcohol use: No  . Drug use: No  . Sexual activity: Not on file  Lifestyle  . Physical activity    Days per week:  Not on file    Minutes per session: Not on file  . Stress: Not on file  Relationships  . Social Herbalist on phone: Not on file    Gets together: Not on file    Attends religious service: Not on file    Active member of club or organization: Not on file    Attends meetings of clubs or organizations: Not on file    Relationship status: Not on file  . Intimate partner violence    Fear of current or ex partner: Not on file    Emotionally abused: Not on file    Physically abused: Not on file    Forced sexual activity: Not on file  Other Topics Concern  . Not on file  Social History Narrative  . Not on file    Family History  Problem Relation Age of Onset  . Diabetes Mother   . Asthma Father   . Breast cancer Paternal Aunt   . Breast cancer Paternal Aunt   . Kidney cancer Neg Hx   . Kidney disease Neg Hx   . Prostate cancer Neg Hx      Current Outpatient Medications:  .  albuterol (PROVENTIL HFA;VENTOLIN HFA) 108 (90 Base) MCG/ACT inhaler, Inhale into the lungs., Disp: , Rfl:  .  atenolol (TENORMIN) 100 MG tablet, Take 100 mg by mouth daily., Disp: , Rfl:  .  buPROPion (WELLBUTRIN) 100 MG tablet, Take 100 mg by mouth 2 (two) times daily., Disp: , Rfl:  .  clobetasol ointment (TEMOVATE) [id number] %, Apply 1 application topically 2 (two) times daily., Disp: , Rfl:  .  Colchicine 0.6 MG CAPS, Take 2 tablets by mouth as needed., Disp: , Rfl:  .  desvenlafaxine (PRISTIQ) 100 MG 24 hr tablet, Take 100 mg by mouth daily., Disp: , Rfl:  .  furosemide (LASIX) 20 MG tablet, Take 20 mg by mouth daily. , Disp: , Rfl:  .  haloperidol (HALDOL) 2 MG tablet, Take 2 mg by mouth 2 (two) times daily., Disp: , Rfl:  .  lisdexamfetamine (VYVANSE) 40 MG capsule, Take 60 mg by mouth every morning. , Disp: , Rfl:  .  metFORMIN (GLUCOPHAGE) 500 MG tablet, Take by mouth 2 (two) times daily with a meal., Disp: , Rfl:  .  quinapril (ACCUPRIL) 20 MG tablet, Take 20 mg by mouth at bedtime., Disp: ,  Rfl:  .  Rivaroxaban 15 & 20 MG TBPK, Take as directed on package: Start with one 15mg  tablet by mouth twice a day with food. On Day 22, switch to one 20mg  tablet once a day with food., Disp: 51 each, Rfl: 0 .  traZODone (DESYREL) 150 MG tablet, Take by mouth at bedtime., Disp: , Rfl:  .  VYVANSE 60 MG capsule, , Disp: , Rfl:  .  warfarin (COUMADIN) 5 MG tablet, Take 5 mg by mouth daily., Disp: , Rfl:  .  XARELTO 20 MG TABS tablet, Take 20 mg by mouth daily with supper. , Disp: , Rfl:  Physical exam:  Vitals:   06/12/19 1002  BP: 113/77  Pulse: (!) 2  Resp: 18  Temp: (!) 97.2 F (36.2 C)  TempSrc: Tympanic  SpO2: 100%  Weight: (!) 455 lb 9.6 oz (206.7 kg)  Height: 5\' 11"  (1.803 m)   Physical Exam Constitutional:      Comments: Patient is morbidly obese  HENT:     Head: Normocephalic and atraumatic.  Eyes:     Pupils: Pupils are equal, round, and reactive to light.  Neck:     Musculoskeletal: Normal range of motion.  Cardiovascular:     Rate and Rhythm: Normal rate and regular rhythm.     Heart sounds: Normal heart sounds.  Pulmonary:     Effort: Pulmonary effort is normal.     Breath sounds: Normal breath sounds.  Abdominal:     General: Bowel sounds are normal.     Palpations: Abdomen is soft.  Musculoskeletal:     Comments: Bilateral compression stockings in place  Skin:    General: Skin is warm and dry.  Neurological:     Mental Status: He is alert and oriented to person, place, and time.      CMP Latest Ref Rng & Units 01/10/2017  Glucose 65 - 99 mg/dL 106(H)  BUN 6 - 20 mg/dL 14  Creatinine 0.61 - 1.24 mg/dL 0.74  Sodium 135 - 145 mmol/L 134(L)  Potassium 3.5 - 5.1 mmol/L 4.0  Chloride 101 - 111 mmol/L 101  CO2 22 - 32 mmol/L 27  Calcium 8.9 - 10.3 mg/dL 8.7(L)  Total Protein 6.5 - 8.1 g/dL 7.9  Total Bilirubin 0.3 - 1.2 mg/dL 0.5  Alkaline Phos 38 - 126 U/L 53  AST 15 - 41 U/L 19  ALT 17 - 63 U/L 14(L)   CBC Latest Ref Rng & Units 01/10/2017  WBC 3.8  - 10.6 K/uL 11.7(H)  Hemoglobin 13.0 - 18.0 g/dL 14.9  Hematocrit 40.0 - 52.0 % 44.2  Platelets 150 - 440 K/uL 254      Assessment and plan- Patient is a 58 y.o. male with history of right lower extremity DVT in 2017 and presently on Coumadin  Patient had a seemingly unprovoked episode of right proximal lower extremity DVT in 2017.  He has multiple risk factors including morbid obesity, male sex and I therefore recommended indefinite anticoagulation for him.  Hypercoagulable work-up revealed single mutation and factor V Leiden which does not influence the duration of anticoagulation but he needs indefinite anticoagulation regardless due to above reasons.  Antiphospholipid antibody syndrome testing was negative.  Recently newer anticoagulants have been studied in obese patients and the [facility] study which mainly looked at patients who are on Rivaroxaban showed no association between weight or BMI and thrombotic or bleeding outcomes in patients.  It would therefore be okay for patient to be switched to rivaroxaban from coumadin if it is more convenient for the patient and if it has been difficult to maintain his INR in the therapeutic range.  I would like to make sure that his renal functions are adequate and I will therefore proceed with a CBC with differential and CMP at this time.  Patient will need to remain on lifelong anticoagulation unless there are concerns of bleeding and the risk of bleeding outweighs the risk of lifelong anticoagulation.   Total face to face encounter time for this patient visit was 30 min. >50% of the time was  spent in counseling and coordination of care.  Visit Diagnosis 1. History of DVT (deep vein thrombosis)      Dr. Randa Evens, MD, MPH [facility] [id number] 06/12/2019 11:18 AM

## 2019-06-12 NOTE — Progress Notes (Signed)
Patient here to establish care. Voices no concerns.

## 2019-12-20 IMAGING — US RIGHT LOWER EXTREMITY VENOUS ULTRASOUND
1 series · 14 of 24 positions shown · non-contrast
Comparison: 04/30/2016 and previous

CLINICAL DATA: Swelling.  History of DVT.

EXAM:
RIGHT LOWER EXTREMITY VENOUS DOPPLER ULTRASOUND
TECHNIQUE: Gray-scale sonography with compression, as well as color and duplex
ultrasound, were performed to evaluate the deep venous system from
the level of the common femoral vein through the popliteal and
proximal calf veins.

[Series 1: right lower extremity venous ultrasound · 0.11mm/px · 14 of 34 slices shown]
[im 1/34]
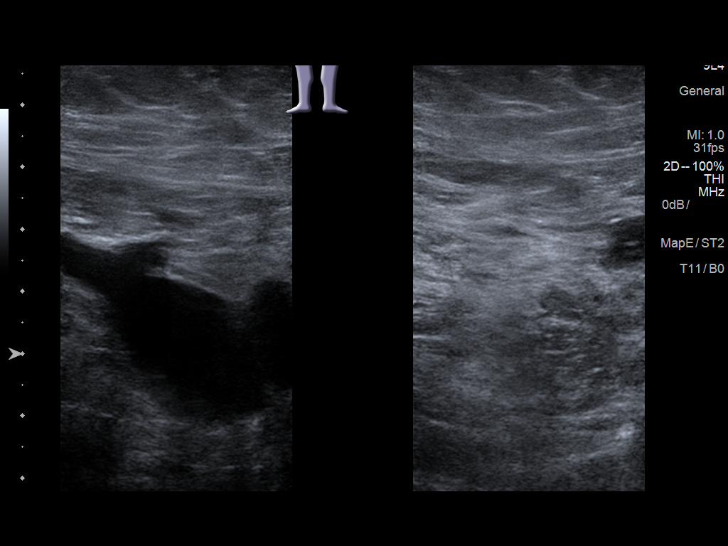
[im 3/34]
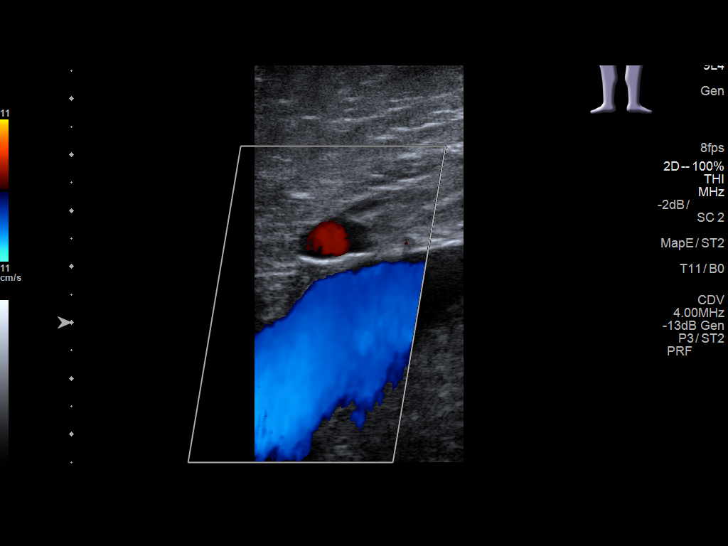
[im 6/34]
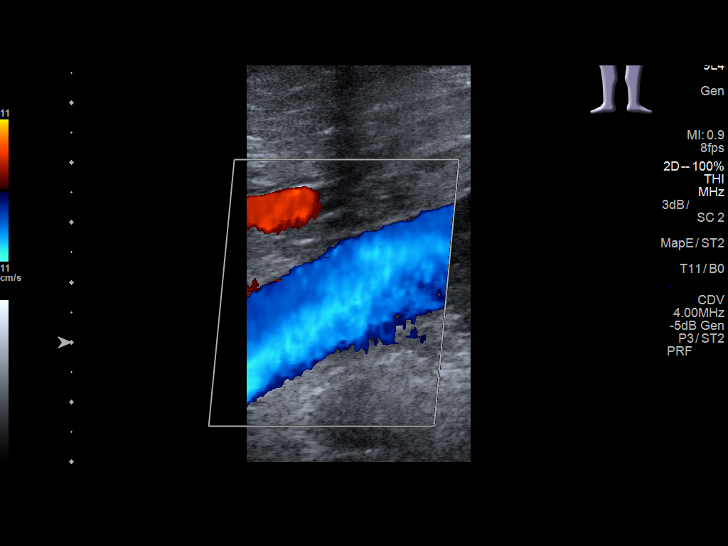
[im 9/34]
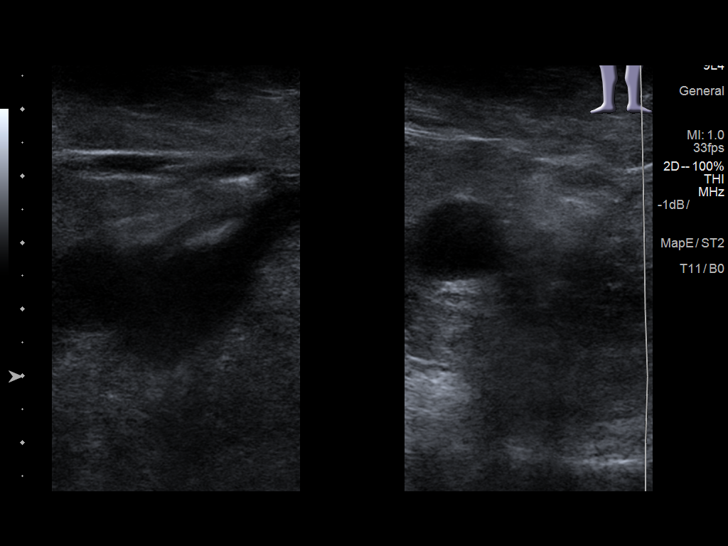
[im 11/34]
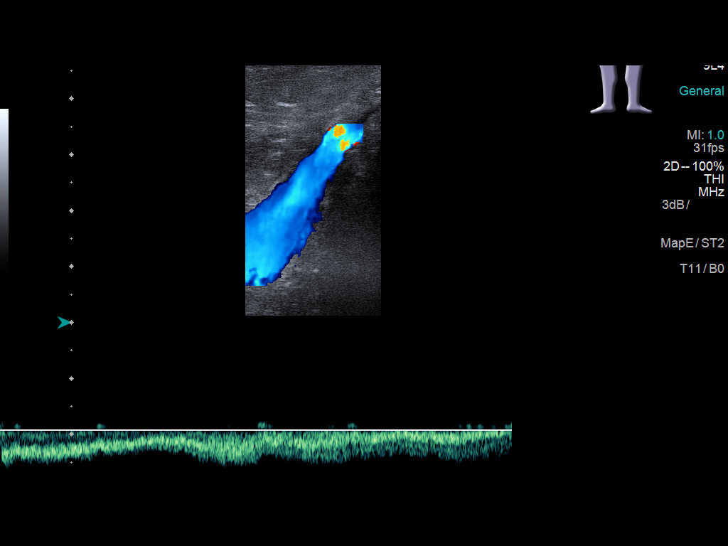
[im 13/34]
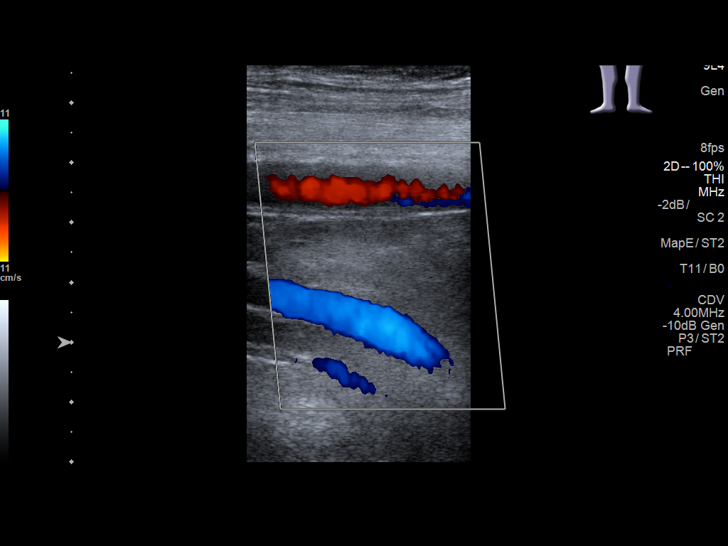
[im 16/34]
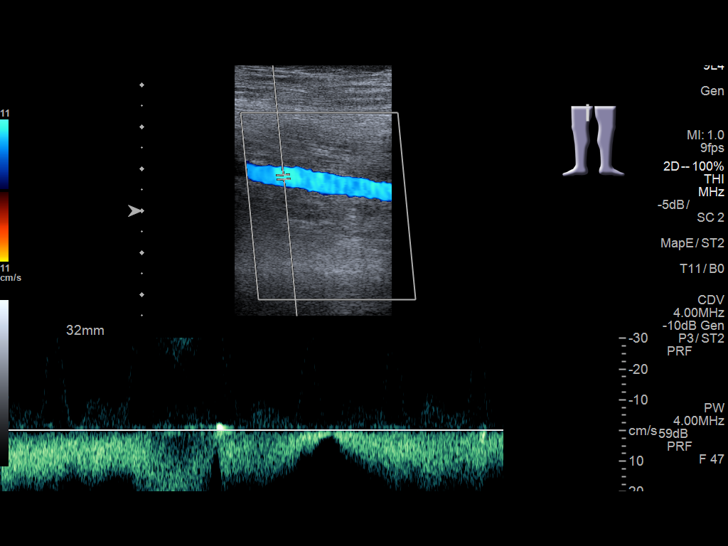
[im 18/34]
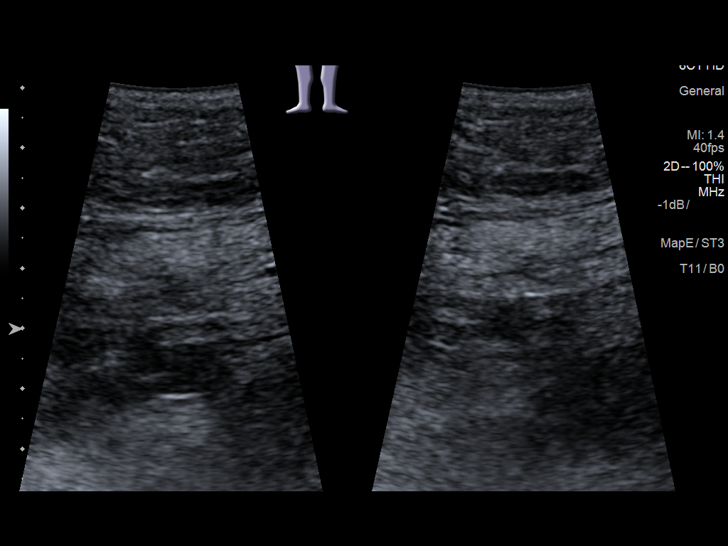
[im 21/34]
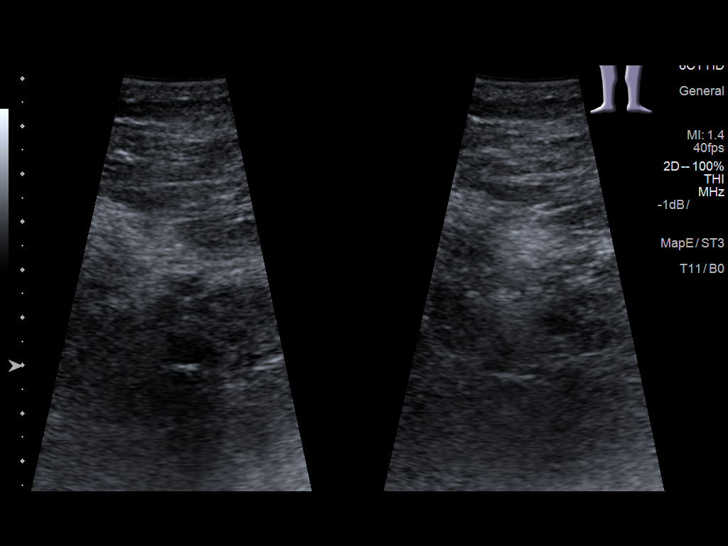
[im 23/34]
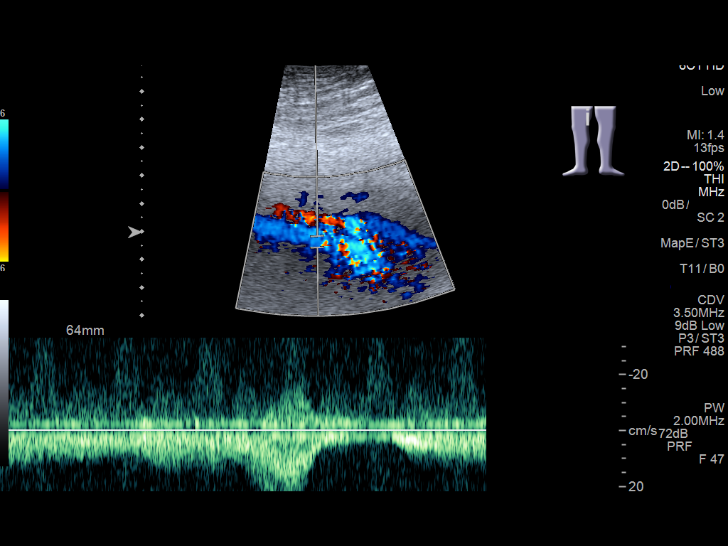
[im 26/34]
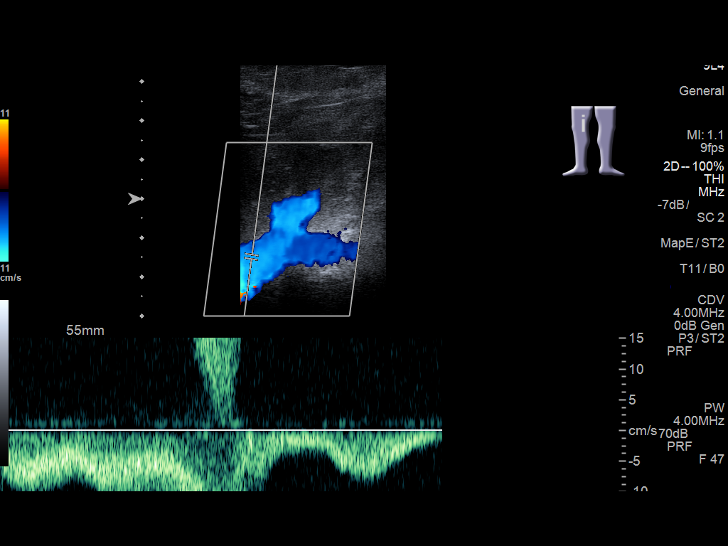
[im 28/34]
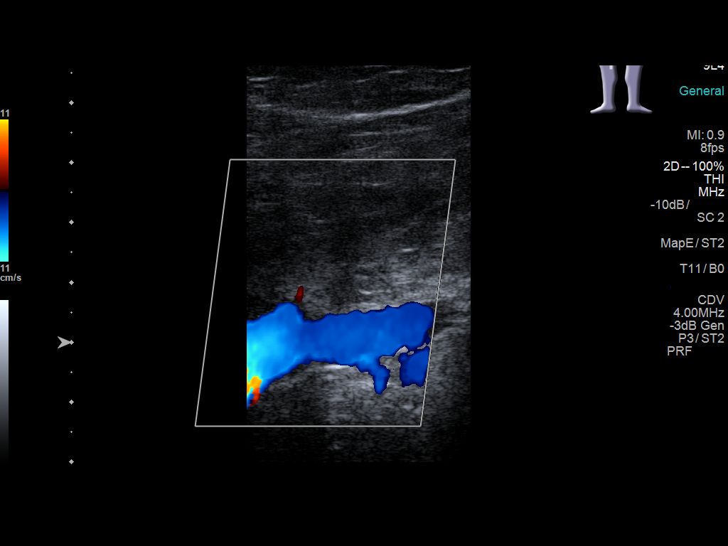
[im 31/34]
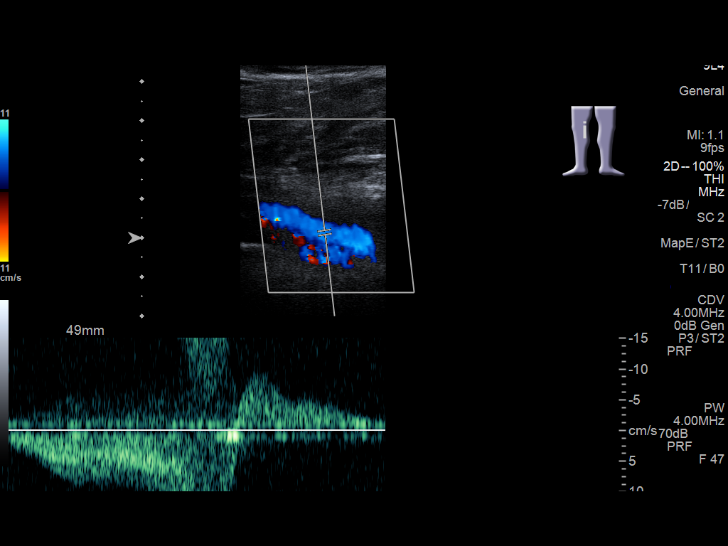
[im 34/34]
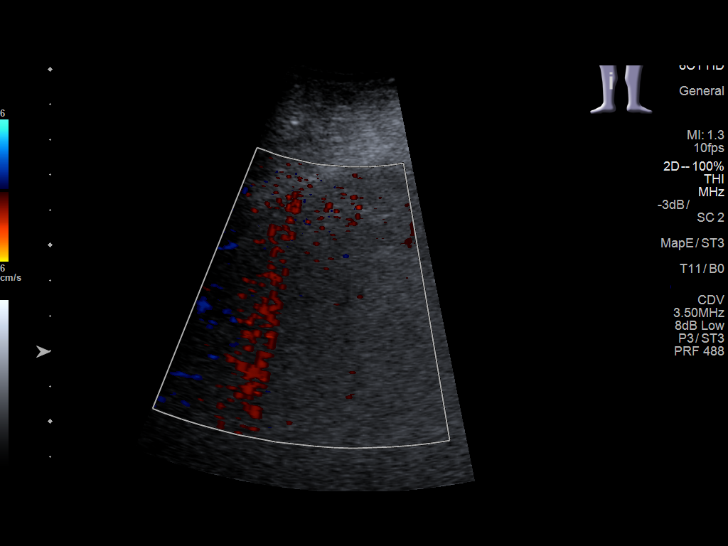

[14 of 24 positions shown; findings below may reference images not displayed]

FINDINGS: Normal compressibility of the common femoral and superficial femoral
veins, as well as the proximal calf veins. There is linear echogenic
eccentric intraluminal web in the popliteal vein, which is
compressible with normal color flow signal. Doppler waveforms show
normal direction of venous flow, normal respiratory phasicity and
response to augmentation.

Survey views of the contralateral common femoral vein are
unremarkable.
IMPRESSION: Chronic post thrombotic change in the right popliteal vein. No acute
DVT.

## 2022-08-11 LAB — PROTIME-INR: INR: 2.3 — AB (ref 0.80–1.20)

## 2022-08-12 ENCOUNTER — Encounter: Payer: Self-pay | Admitting: Family Medicine

## 2022-08-13 ENCOUNTER — Encounter: Payer: Self-pay | Admitting: Family Medicine

## 2022-08-16 ENCOUNTER — Other Ambulatory Visit: Payer: Self-pay

## 2022-08-16 ENCOUNTER — Telehealth: Payer: Self-pay

## 2022-08-16 DIAGNOSIS — Z86010 Personal history of colonic polyps: Secondary | ICD-10-CM

## 2022-08-16 MED ORDER — NA SULFATE-K SULFATE-MG SULF 17.5-3.13-1.6 GM/177ML PO SOLN: 1.0000 | Freq: Once | ORAL | 0 refills | Status: AC

## 2022-08-16 NOTE — Telephone Encounter (Signed)
Gastroenterology Pre-Procedure Review  Request Date: 09/14/22 Requesting Physician: Dr. Vicente Males  PATIENT REVIEW QUESTIONS: The patient responded to the following health history questions as indicated:    1. Are you having any GI issues? no 2. Do you have a personal history of Polyps? yes (last colonoscopy 11/02/18) 3. Do you have a family history of Colon Cancer or Polyps? no 4. Diabetes Mellitus? no 5. Joint replacements in the past 12 months?no 6. Major health problems in the past 3 months?no 7. Any artificial heart valves, MVP, or defibrillator?no    MEDICATIONS & ALLERGIES:    Patient reports the following regarding taking any anticoagulation/antiplatelet therapy:   Plavix, Coumadin, Eliquis, Xarelto, Lovenox, Pradaxa, Brilinta, or Effient? no Aspirin? no  Patient confirms/reports the following medications:  Current Outpatient Medications  Medication Sig Dispense Refill   albuterol (PROVENTIL HFA;VENTOLIN HFA) 108 (90 Base) MCG/ACT inhaler Inhale into the lungs.     atenolol (TENORMIN) 100 MG tablet Take 100 mg by mouth daily.     buPROPion (WELLBUTRIN) 100 MG tablet Take 100 mg by mouth 2 (two) times daily.     clobetasol ointment (TEMOVATE) 1.61 % Apply 1 application topically 2 (two) times daily.     Colchicine 0.6 MG CAPS Take 2 tablets by mouth as needed.     desvenlafaxine (PRISTIQ) 100 MG 24 hr tablet Take 100 mg by mouth daily.     furosemide (LASIX) 20 MG tablet Take 20 mg by mouth daily.      haloperidol (HALDOL) 2 MG tablet Take 2 mg by mouth 2 (two) times daily.     lisdexamfetamine (VYVANSE) 40 MG capsule Take 60 mg by mouth every morning.      metFORMIN (GLUCOPHAGE) 500 MG tablet Take by mouth 2 (two) times daily with a meal.     quinapril (ACCUPRIL) 20 MG tablet Take 20 mg by mouth at bedtime.     Rivaroxaban 15 & 20 MG TBPK Take as directed on package: Start with one '15mg'$  tablet by mouth twice a day with food. On Day 22, switch to one '20mg'$  tablet once a day with  food. 51 each 0   traZODone (DESYREL) 150 MG tablet Take by mouth at bedtime.     VYVANSE 60 MG capsule      warfarin (COUMADIN) 5 MG tablet Take 5 mg by mouth daily.     XARELTO 20 MG TABS tablet Take 20 mg by mouth daily with supper.      No current facility-administered medications for this visit.    Patient confirms/reports the following allergies:  No Known Allergies  No orders of the defined types were placed in this encounter.   AUTHORIZATION INFORMATION Primary Insurance: 1D#: Group #:  Secondary Insurance: 1D#: Group #:  SCHEDULE INFORMATION: Date: 09/14/22 Time: Location: [facility]

## 2022-08-16 NOTE — Telephone Encounter (Signed)
Corrected Triage Notation:  Patient takes Metformin and has been advised to stop it on 09/12/22.  Patient also takes Xarelto and Warfarin.  Blood Thinner request sent to [employer] at [facility].  Fax [phone] Office # [phone]  Thanks,  Sharyn Lull, CMA

## 2022-08-23 ENCOUNTER — Telehealth: Payer: Self-pay

## 2022-08-23 NOTE — Telephone Encounter (Signed)
Received voice message from Dr. Gabriel Rung stating that patient is not on Xarelto however he is on Warfarin. 09/14/22 Colonoscopy will need to be delayed.  Dr. Foye Deer stated that patient will need to see Hematology to be advised on stopping Warfarin.  He has an appt scheduled with them on 09/29/22  Colonoscopy will need to be delayed until he has been seen by hematologist.  Blood thinner advice was sent to Dr. Janese Banks by Dr. Gabriel Rung. Trish in Endo notified of cancellation.  Patient has been informed that we will need to delay colonoscopy until after he has been evaluated by hematology.  Thanks,  Sharyn Lull CMA

## 2022-09-14 ENCOUNTER — Ambulatory Visit: Admit: 2022-09-14 | Payer: Medicare Other | Admitting: Gastroenterology

## 2022-09-14 SURGERY — COLONOSCOPY WITH PROPOFOL
Anesthesia: General

## 2022-09-28 ENCOUNTER — Telehealth: Payer: Self-pay | Admitting: *Deleted

## 2022-09-28 NOTE — Telephone Encounter (Signed)
Unable to get in touch with pt about his appt tom.

## 2022-09-29 ENCOUNTER — Inpatient Hospital Stay: Payer: 59 | Attending: Oncology | Admitting: Oncology

## 2022-09-29 ENCOUNTER — Inpatient Hospital Stay: Payer: 59

## 2022-09-29 ENCOUNTER — Encounter: Payer: Self-pay | Admitting: Oncology

## 2022-09-29 VITALS — BP 124/80 | HR 73 | Resp 18 | Wt >= 6400 oz

## 2022-09-29 DIAGNOSIS — Z79899 Other long term (current) drug therapy: Secondary | ICD-10-CM | POA: Diagnosis not present

## 2022-09-29 DIAGNOSIS — Z86718 Personal history of other venous thrombosis and embolism: Secondary | ICD-10-CM | POA: Diagnosis present

## 2022-09-29 DIAGNOSIS — D6851 Activated protein C resistance: Secondary | ICD-10-CM | POA: Insufficient documentation

## 2022-09-29 DIAGNOSIS — Z833 Family history of diabetes mellitus: Secondary | ICD-10-CM | POA: Insufficient documentation

## 2022-09-29 DIAGNOSIS — Z825 Family history of asthma and other chronic lower respiratory diseases: Secondary | ICD-10-CM | POA: Diagnosis not present

## 2022-09-29 DIAGNOSIS — Z803 Family history of malignant neoplasm of breast: Secondary | ICD-10-CM | POA: Diagnosis not present

## 2022-09-29 DIAGNOSIS — R634 Abnormal weight loss: Secondary | ICD-10-CM | POA: Diagnosis not present

## 2022-09-29 DIAGNOSIS — Z7901 Long term (current) use of anticoagulants: Secondary | ICD-10-CM | POA: Diagnosis not present

## 2022-09-29 NOTE — Progress Notes (Signed)
Hematology/Oncology Consult note [facility] [phone] [phone]  Patient Care Team: Letta Median, MD as PCP - General (Family Medicine)   Name of the patient: Eric Berg  [id number]  [birth year]    Reason for referral-history of DVT   Referring physician-Dr. 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 Doppler on 04/30/16 showed: Short segment nonocclusive right popliteal DVT over a short segment. Very low thrombus burden. No evidence of propagation into the femoral veins.  He was on Xarelto briefly but had switched him to Coumadin given the fact that he had morbid obesity.  Patient is currently on Coumadin and gets his INR checked once a month.  He has not had any recurrent episodes of thrombosis.  He is working on his weight loss.  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  Other hypercoagulable workup was negative.  Antiphospholipid antibody syndrome testing negative.  ECOG PS- 2  Pain scale- 0   Review of systems- Review of Systems  Constitutional:  Negative for chills, fever, malaise/fatigue and weight loss.  HENT:  Negative for congestion, ear discharge and nosebleeds.   Eyes:  Negative for blurred vision.  Respiratory:  Negative for cough, hemoptysis, sputum production, shortness of breath and wheezing.   Cardiovascular:  Negative for chest pain, palpitations, orthopnea and claudication.  Gastrointestinal:  Negative for abdominal pain, blood in stool, constipation, diarrhea, heartburn, melena, nausea and vomiting.  Genitourinary:  Negative for dysuria, flank pain, frequency, hematuria and urgency.  Musculoskeletal:  Negative for back pain, joint pain and myalgias.  Skin:  Negative for rash.  Neurological:  Negative for dizziness, tingling, focal weakness, seizures,  weakness and headaches.  Endo/Heme/Allergies:  Does not bruise/bleed easily.  Psychiatric/Behavioral:  Negative for depression and suicidal ideas. The patient does not have insomnia.     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   Hypertension 01/10/2017   DVT (deep venous thrombosis) ([place]) 01/10/2017   Morbid obesity ([place]) 01/10/2017     Past Medical History:  Diagnosis Date   Anxiety    Asthma    Depression    DVT (deep venous thrombosis) ([place]) 04/2016   Gout    Hematuria    Hyperlipidemia    Hypertension    Lip cancer    Morbid (severe) obesity due to excess calories ([place])    Sleep apnea    Stasis dermatitis      Past Surgical History:  Procedure Laterality Date   COLONOSCOPY WITH PROPOFOL N/A 11/02/2018   Procedure: COLONOSCOPY WITH PROPOFOL;  Surgeon: [name], MD;  Location: [facility] ENDOSCOPY;  Service: Gastroenterology;  Laterality: N/A;   EYE SURGERY     lip cancer removed       Social History   Socioeconomic History   Marital status: Married    Spouse name: Not on file   Number of children: Not on file   Years of education: Not on file   Highest education level: Not on file  Occupational History   Not on file  Tobacco Use   Smoking status: Never   Smokeless tobacco: Never  Vaping Use   Vaping Use: Never used  Substance and Sexual Activity  Alcohol use: No   Drug use: No   Sexual activity: Not on file  Other Topics Concern   Not on file  Social History Narrative   Not on file   Social Determinants of Health   Financial Resource Strain: Not on file  Food Insecurity: Not on file  Transportation Needs: Not on file  Physical Activity: Not on file  Stress: Not on file  Social Connections: Not on file  Intimate Partner Violence: Not on file     Family History  Problem  Relation Age of Onset   Diabetes Mother    Asthma Father    Breast cancer Paternal Aunt    Breast cancer Paternal Aunt    Kidney cancer Neg Hx    Kidney disease Neg Hx    Prostate cancer Neg Hx      Current Outpatient Medications:    acetaminophen (TYLENOL) 325 MG tablet, Take by mouth., Disp: , Rfl:    albuterol (PROVENTIL HFA;VENTOLIN HFA) 108 (90 Base) MCG/ACT inhaler, Inhale into the lungs., Disp: , Rfl:    atenolol (TENORMIN) 100 MG tablet, Take 100 mg by mouth daily., Disp: , Rfl:    atorvastatin (LIPITOR) 10 MG tablet, Take 10 mg by mouth daily., Disp: , Rfl:    buPROPion (WELLBUTRIN) 100 MG tablet, Take 100 mg by mouth 2 (two) times daily., Disp: , Rfl:    clobetasol ointment (TEMOVATE) 3.81 %, Apply 1 application topically 2 (two) times daily., Disp: , Rfl:    desvenlafaxine (PRISTIQ) 100 MG 24 hr tablet, Take 100 mg by mouth daily., Disp: , Rfl:    furosemide (LASIX) 20 MG tablet, Take 20 mg by mouth daily. , Disp: , Rfl:    haloperidol (HALDOL) 2 MG tablet, Take 2 mg by mouth 2 (two) times daily., Disp: , Rfl:    lisdexamfetamine (VYVANSE) 40 MG capsule, Take 60 mg by mouth every morning. , Disp: , Rfl:    lisinopril (ZESTRIL) 20 MG tablet, Take 20 mg by mouth daily., Disp: , Rfl:    metFORMIN (GLUCOPHAGE) 500 MG tablet, Take by mouth 2 (two) times daily with a meal., Disp: , Rfl:    OZEMPIC, 1 MG/DOSE, 4 MG/3ML SOPN, Inject into the skin., Disp: , Rfl:    quinapril (ACCUPRIL) 20 MG tablet, Take 20 mg by mouth at bedtime., Disp: , Rfl:    traZODone (DESYREL) 150 MG tablet, Take by mouth at bedtime., Disp: , Rfl:    warfarin (COUMADIN) 5 MG tablet, Take 5 mg by mouth daily., Disp: , Rfl:    XARELTO 20 MG TABS tablet, Take 20 mg by mouth daily with supper. , Disp: , Rfl:    Physical exam:  Vitals:   09/29/22 1100 09/29/22 1107  BP:  124/80  Pulse:  73  Resp:  18  SpO2:  97%  Weight: (!) 423 lb (191.9 kg)    Physical Exam Constitutional:      Appearance: He is obese.   Cardiovascular:     Rate and Rhythm: Normal rate and regular rhythm.     Heart sounds: Normal heart sounds.  Pulmonary:     Effort: Pulmonary effort is normal.     Breath sounds: Normal breath sounds.  Abdominal:     General: Bowel sounds are normal.     Palpations: Abdomen is soft.  Skin:    General: Skin is warm and dry.  Neurological:     Mental Status: He is alert and oriented to person, place, and time.  Latest Ref Rng & Units 06/12/2019   11:35 AM  CMP  Glucose 70 - 99 mg/dL 84   BUN 6 - 20 mg/dL 17   Creatinine 0.61 - 1.24 mg/dL 0.68   Sodium 135 - 145 mmol/L 139   Potassium 3.5 - 5.1 mmol/L 4.1   Chloride 98 - 111 mmol/L 106   CO2 22 - 32 mmol/L 26   Calcium 8.9 - 10.3 mg/dL 9.4   Total Protein 6.5 - 8.1 g/dL 7.8   Total Bilirubin 0.3 - 1.2 mg/dL 0.4   Alkaline Phos 38 - 126 U/L 55   AST 15 - 41 U/L 18   ALT 0 - 44 U/L 17       Latest Ref Rng & Units 06/12/2019   11:35 AM  CBC  WBC 4.0 - 10.5 K/uL 7.1   Hemoglobin 13.0 - 17.0 g/dL 13.8   Hematocrit 39.0 - 52.0 % 41.2   Platelets 150 - 400 K/uL 226     No images are attached to the encounter.  No results found.  Assessment and plan- Patient is a 62 y.o. male with history of right lower extremity DVT on Coumadin here to reestablish follow-up  Patient had right proximal lower extremity DVT in 2017.  Given his morbid obesity and male sex as well as unprovoked episode indefinite anticoagulation was recommended.  Initially he was on Xarelto and then switched to Coumadin due to concern for recurrence as well as given the fact that he was morbidly obese and newer anticoagulants were not studied in  obese patients.  Currently patient is on Coumadin and reports that his INR levels have been stable which she gets it checked once a month.  As he has not had any recurrent episodes of DVT on Coumadin it would be okay for the patient to stay on Coumadin instead of switching back to Eliquis or Xarelto.  If patient  has trouble keeping his INR within range or does not wish to go for INR checks it would be okay to switch him to Eliquis or Xarelto since there are no retrospective studies looking at efficacy of newer anticoagulants in obese patients and have been found to be safe.  For any elective procedures in the future patient needs to come off Coumadin for 5 days.  He does not require any bridging Lovenox.  He can restart Coumadin after procedure back to his original dose and his INR is likely to become therapeutic after a week or 10 days which is okay even without Lovenox bridging.  He does not require any follow-up with me at this time and can continue to follow-up with his primary care doctor while continuing Coumadin   Thank you for this kind referral and the opportunity to participate in the care of this patient   Visit Diagnosis 1. History of deep vein thrombosis (DVT) of lower extremity   2. Current use of long term anticoagulation     Dr. Randa Evens, MD, MPH [facility] at [facility] [phone] 09/29/2022

## 2022-11-15 ENCOUNTER — Other Ambulatory Visit: Payer: Self-pay

## 2022-11-15 ENCOUNTER — Telehealth: Payer: Self-pay

## 2022-11-15 ENCOUNTER — Encounter: Payer: Self-pay | Admitting: Family Medicine

## 2022-11-15 DIAGNOSIS — Z86010 Personal history of colonic polyps: Secondary | ICD-10-CM

## 2022-11-15 NOTE — Telephone Encounter (Signed)
Gastroenterology Pre-Procedure Review  Request Date: 01/18/23 Requesting Physician: Dr. Vicente Males  PATIENT REVIEW QUESTIONS: The patient responded to the following health history questions as indicated:    1. Are you having any GI issues? no 2. Do you have a personal history of Polyps? yes (11/02/18 performed by Dr. Vicente Males) 3. Do you have a family history of Colon Cancer or Polyps? no 4. Diabetes Mellitus?Pre-diabetic. Pt does take Metformin.  Has been advised to stop Metformin 2 days before his colonoscopy.  Ozempic stop 7 days before colonoscopy. 5. Joint replacements in the past 12 months?no 6. Major health problems in the past 3 months?no 7. Any artificial heart valves, MVP, or defibrillator?no    MEDICATIONS & ALLERGIES:    Patient reports the following regarding taking any anticoagulation/antiplatelet therapy:   Plavix, Coumadin, Eliquis, Xarelto, Lovenox, Pradaxa, Brilinta, or Effient? Blood thinner advice has been requested from Dr. Janese Banks Aspirin? no  Patient confirms/reports the following medications:  Current Outpatient Medications  Medication Sig Dispense Refill   acetaminophen (TYLENOL) 325 MG tablet Take by mouth.     albuterol (PROVENTIL HFA;VENTOLIN HFA) 108 (90 Base) MCG/ACT inhaler Inhale into the lungs.     atenolol (TENORMIN) 100 MG tablet Take 100 mg by mouth daily.     atorvastatin (LIPITOR) 10 MG tablet Take 10 mg by mouth daily.     buPROPion (WELLBUTRIN) 100 MG tablet Take 100 mg by mouth 2 (two) times daily.     clobetasol ointment (TEMOVATE) AB-[id number] % Apply 1 application topically 2 (two) times daily.     desvenlafaxine (PRISTIQ) 100 MG 24 hr tablet Take 100 mg by mouth daily.     furosemide (LASIX) 20 MG tablet Take 20 mg by mouth daily.      haloperidol (HALDOL) 2 MG tablet Take 2 mg by mouth 2 (two) times daily.     lisdexamfetamine (VYVANSE) 40 MG capsule Take 60 mg by mouth every morning.      lisinopril (ZESTRIL) 20 MG tablet Take 20 mg by mouth daily.      metFORMIN (GLUCOPHAGE) 500 MG tablet Take by mouth 2 (two) times daily with a meal.     OZEMPIC, 1 MG/DOSE, 4 MG/3ML SOPN Inject into the skin.     quinapril (ACCUPRIL) 20 MG tablet Take 20 mg by mouth at bedtime.     traZODone (DESYREL) 150 MG tablet Take by mouth at bedtime.     warfarin (COUMADIN) 5 MG tablet Take 5 mg by mouth daily.     XARELTO 20 MG TABS tablet Take 20 mg by mouth daily with supper.      No current facility-administered medications for this visit.    Patient confirms/reports the following allergies:  No Known Allergies  No orders of the defined types were placed in this encounter.   AUTHORIZATION INFORMATION Primary Insurance: 1D#: Group #:  Secondary Insurance: 1D#: Group #:  SCHEDULE INFORMATION: Date: 01/18/23 Time: Location: [facility]

## 2022-12-06 ENCOUNTER — Telehealth: Payer: Self-pay

## 2022-12-06 NOTE — Telephone Encounter (Signed)
Patient and his wife have been informed to stop Warfarin 5 days prior to colonoscopy on 01/13/23 per Dr. Elroy Channel completed blood thinner advice received by fax on 12/01/22.  Thanks,  Sharyn Lull CMA

## 2022-12-31 ENCOUNTER — Other Ambulatory Visit: Payer: Self-pay

## 2022-12-31 MED ORDER — PHYTONADIONE 5 MG PO TABS
ORAL_TABLET | ORAL | 0 refills | Status: AC
  Filled 2022-12-31: qty 2, 2d supply, fill #0

## 2023-01-06 ENCOUNTER — Telehealth: Payer: Self-pay

## 2023-01-06 NOTE — Telephone Encounter (Signed)
Received this message from Dava, RN in Endo from patient-"pt called reports he was switched from coumadin to eliquis now and wants to know how many days to hold med. reports taking ozempic sq. I/s pt to hold that med 7 days. please f/u".  Patient stated that his PCP changed him from Coumadin to Eliquis. I have contacted patients PCP Ina Homes to let her know I need to be advised of stop date for Eliquis.   Request has been faxed to Phineas Real at [phone].  Office # [phone].  Thanks, [place], [place]

## 2023-01-13 NOTE — Telephone Encounter (Signed)
Left voice message on patients home phone advising to stop Eliquis 2 days prior to his colonoscopy which will be 05/12.  Resume 1 day after colonoscopy on 01/19/23.  Advice received from Dr. Ina Homes by fax on 01/06/23.  Thanks,  [place], [place]

## 2023-01-17 ENCOUNTER — Encounter: Payer: Self-pay | Admitting: Gastroenterology

## 2023-01-18 ENCOUNTER — Ambulatory Visit: Payer: 59 | Admitting: Certified Registered"

## 2023-01-18 ENCOUNTER — Other Ambulatory Visit: Payer: Self-pay

## 2023-01-18 ENCOUNTER — Encounter
Admission: RE | Disposition: A | Discharge status: Home / Self Care | Payer: Self-pay | Source: Home / Self Care | Attending: Gastroenterology

## 2023-01-18 ENCOUNTER — Encounter: Payer: Self-pay | Admitting: Gastroenterology

## 2023-01-18 ENCOUNTER — Ambulatory Visit
Admission: RE | Admit: 2023-01-18 | Discharge: 2023-01-18 | Disposition: A | Discharge status: Home / Self Care | Payer: 59 | Attending: Gastroenterology | Admitting: Gastroenterology

## 2023-01-18 DIAGNOSIS — F419 Anxiety disorder, unspecified: Secondary | ICD-10-CM | POA: Insufficient documentation

## 2023-01-18 DIAGNOSIS — Z79899 Other long term (current) drug therapy: Secondary | ICD-10-CM | POA: Diagnosis not present

## 2023-01-18 DIAGNOSIS — Z1211 Encounter for screening for malignant neoplasm of colon: Secondary | ICD-10-CM | POA: Insufficient documentation

## 2023-01-18 DIAGNOSIS — Z86010 Personal history of colonic polyps: Secondary | ICD-10-CM | POA: Insufficient documentation

## 2023-01-18 DIAGNOSIS — D122 Benign neoplasm of ascending colon: Secondary | ICD-10-CM | POA: Diagnosis not present

## 2023-01-18 DIAGNOSIS — Z6841 Body Mass Index (BMI) 40.0 and over, adult: Secondary | ICD-10-CM | POA: Insufficient documentation

## 2023-01-18 DIAGNOSIS — I10 Essential (primary) hypertension: Secondary | ICD-10-CM | POA: Diagnosis not present

## 2023-01-18 DIAGNOSIS — Z7901 Long term (current) use of anticoagulants: Secondary | ICD-10-CM | POA: Diagnosis not present

## 2023-01-18 DIAGNOSIS — Z86718 Personal history of other venous thrombosis and embolism: Secondary | ICD-10-CM | POA: Insufficient documentation

## 2023-01-18 DIAGNOSIS — M109 Gout, unspecified: Secondary | ICD-10-CM | POA: Insufficient documentation

## 2023-01-18 DIAGNOSIS — Z7985 Long-term (current) use of injectable non-insulin antidiabetic drugs: Secondary | ICD-10-CM | POA: Insufficient documentation

## 2023-01-18 DIAGNOSIS — Z7984 Long term (current) use of oral hypoglycemic drugs: Secondary | ICD-10-CM | POA: Insufficient documentation

## 2023-01-18 DIAGNOSIS — D124 Benign neoplasm of descending colon: Secondary | ICD-10-CM | POA: Diagnosis not present

## 2023-01-18 DIAGNOSIS — E1165 Type 2 diabetes mellitus with hyperglycemia: Secondary | ICD-10-CM | POA: Diagnosis not present

## 2023-01-18 DIAGNOSIS — G4730 Sleep apnea, unspecified: Secondary | ICD-10-CM | POA: Insufficient documentation

## 2023-01-18 DIAGNOSIS — J45909 Unspecified asthma, uncomplicated: Secondary | ICD-10-CM | POA: Insufficient documentation

## 2023-01-18 DIAGNOSIS — I872 Venous insufficiency (chronic) (peripheral): Secondary | ICD-10-CM | POA: Insufficient documentation

## 2023-01-18 DIAGNOSIS — F32A Depression, unspecified: Secondary | ICD-10-CM | POA: Insufficient documentation

## 2023-01-18 DIAGNOSIS — E785 Hyperlipidemia, unspecified: Secondary | ICD-10-CM | POA: Insufficient documentation

## 2023-01-18 DIAGNOSIS — D126 Benign neoplasm of colon, unspecified: Secondary | ICD-10-CM

## 2023-01-18 DIAGNOSIS — D120 Benign neoplasm of cecum: Secondary | ICD-10-CM | POA: Insufficient documentation

## 2023-01-18 HISTORY — PX: COLONOSCOPY WITH PROPOFOL: SHX5780

## 2023-01-18 SURGERY — COLONOSCOPY WITH PROPOFOL
Anesthesia: General

## 2023-01-18 MED ORDER — PROPOFOL 500 MG/50ML IV EMUL
INTRAVENOUS | Status: DC | PRN
  Administered 2023-01-18: 155 ug/kg/min via INTRAVENOUS

## 2023-01-18 MED ORDER — LIDOCAINE HCL (CARDIAC) PF 100 MG/5ML IV SOSY
PREFILLED_SYRINGE | INTRAVENOUS | Status: DC | PRN
  Administered 2023-01-18: 100 mg via INTRAVENOUS

## 2023-01-18 MED ORDER — PROPOFOL 10 MG/ML IV BOLUS
INTRAVENOUS | Status: DC | PRN
  Administered 2023-01-18: 60 mg via INTRAVENOUS

## 2023-01-18 MED ORDER — DEXMEDETOMIDINE HCL IN NACL 200 MCG/50ML IV SOLN
INTRAVENOUS | Status: DC | PRN
  Administered 2023-01-18: 12 ug via INTRAVENOUS

## 2023-01-18 MED ORDER — SODIUM CHLORIDE 0.9 % IV SOLN: INTRAVENOUS | Status: DC

## 2023-01-18 NOTE — Op Note (Signed)
[facility] Gastroenterology Patient Name: Eric Berg Procedure Date: 01/18/2023 10:13 AM MRN: [id number] Account #: [id number] Date of Birth: Mar 01, 1961 Admit Type: Outpatient Age: 62 Room: [facility] ENDO ROOM 2 Gender: Male Note Status: Finalized Instrument Name: Prentice Docker [id number] Procedure:             Colonoscopy Indications:           Surveillance: Personal history of adenomatous polyps                         on last colonoscopy 5 years ago Providers:             Wyline Mood MD, MD Medicines:             Monitored Anesthesia Care Complications:         No immediate complications. Procedure:             Pre-Anesthesia Assessment:                        - Prior to the procedure, a History and Physical was                         performed, and patient medications, allergies and                         sensitivities were reviewed. The patient's tolerance                         of previous anesthesia was reviewed.                        - The risks and benefits of the procedure and the                         sedation options and risks were discussed with the                         patient. All questions were answered and informed                         consent was obtained.                        - ASA Grade Assessment: II - A patient with mild                         systemic disease.                        After obtaining informed consent, the colonoscope was                         passed under direct vision. Throughout the procedure,                         the patient's blood pressure, pulse, and oxygen                         saturations were monitored continuously. The  Colonoscope was introduced through the anus and                         advanced to the the cecum, identified by the                         appendiceal orifice. The colonoscopy was performed                         with ease. The patient tolerated the  procedure well.                         The quality of the bowel preparation was excellent.                         The ileocecal valve, appendiceal orifice, and rectum                         were photographed. Findings:      The perianal and digital rectal examinations were normal.      Two sessile polyps were found in the cecum. The polyps were 5 to 7 mm in       size. These polyps were removed with a cold snare. Resection and       retrieval were complete. To prevent bleeding after the polypectomy, one       hemostatic clip was successfully placed. There was no bleeding at the       end of the procedure.      Two sessile polyps were found in the ascending colon. The polyps were 5       to 6 mm in size. These polyps were removed with a cold snare. Resection       and retrieval were complete.      A 5 mm polyp was found in the descending colon. The polyp was sessile.       The polyp was removed with a cold snare. Resection and retrieval were       complete.      The exam was otherwise without abnormality on direct and retroflexion       views. Impression:            - Two 5 to 7 mm polyps in the cecum, removed with a                         cold snare. Resected and retrieved. Clip was placed.                        - Two 5 to 6 mm polyps in the ascending colon, removed                         with a cold snare. Resected and retrieved.                        - One 5 mm polyp in the descending colon, removed with                         a cold snare. Resected and retrieved.                        -  The examination was otherwise normal on direct and                         retroflexion views. Recommendation:        - Discharge patient to home (with escort).                        - Resume previous diet.                        - Continue present medications. Procedure Code(s):     --- Professional ---                        [phone], Colonoscopy, flexible; with removal of                          tumor(s), polyp(s), or other lesion(s) by snare                         technique Diagnosis Code(s):     --- Professional ---                        Z86.010, Personal history of colonic polyps                        D12.0, Benign neoplasm of cecum                        D12.2, Benign neoplasm of ascending colon                        D12.4, Benign neoplasm of descending colon CPT copyright 2022 American Medical Association. All rights reserved. The codes documented in this report are preliminary and upon coder review may  be revised to meet current compliance requirements. Wyline Mood, MD Wyline Mood MD, MD 01/18/2023 10:58:13 AM This report has been signed electronically. Number of Addenda: 0 Note Initiated On: 01/18/2023 10:13 AM Scope Withdrawal Time: 0 hours 13 minutes 10 seconds  Total Procedure Duration: 0 hours 15 minutes 59 seconds  Estimated Blood Loss:  Estimated blood loss: none.      [facility]

## 2023-01-18 NOTE — Transfer of Care (Signed)
Immediate Anesthesia Transfer of Care Note  Patient: Eric Berg  Procedure(s) Performed: COLONOSCOPY WITH PROPOFOL  Patient Location: Endoscopy Unit  Anesthesia Type:General  Level of Consciousness: awake, drowsy, and patient cooperative  Airway & Oxygen Therapy: Patient Spontanous Breathing and Patient connected to face mask oxygen  Post-op Assessment: Report given to RN and Post -op Vital signs reviewed and stable  Post vital signs: Reviewed and stable  Last Vitals:  Vitals Value Taken Time  BP 103/73 01/18/23 1100  Temp    Pulse 80 01/18/23 1104  Resp 20 01/18/23 1104  SpO2 96 % 01/18/23 1104  Vitals shown include unvalidated device data.  Last Pain:  Vitals:   01/18/23 1059  TempSrc:   PainSc: 0-No pain         Complications: No notable events documented.

## 2023-01-18 NOTE — H&P (Signed)
Wyline Mood, MD [address], [address], [place], [place], [address] [address], [place], [place], [address] Phone: [phone]  Fax: [phone]  Primary Care Physician:  [facility]   Pre-Procedure History & Physical: HPI:  Eric Berg is a 62 y.o. male is here for an colonoscopy.   Past Medical History:  Diagnosis Date   Anxiety    Asthma    Depression    DVT (deep venous thrombosis) (HCC) 04/2016   Gout    Hematuria    Hyperlipidemia    Hypertension    Lip cancer    Morbid (severe) obesity due to excess calories (HCC)    Sleep apnea    Stasis dermatitis     Past Surgical History:  Procedure Laterality Date   COLONOSCOPY WITH PROPOFOL N/A 11/02/2018   Procedure: COLONOSCOPY WITH PROPOFOL;  Surgeon: Wyline Mood, MD;  Location: [facility] ENDOSCOPY;  Service: Gastroenterology;  Laterality: N/A;   EYE SURGERY     lip cancer removed       Prior to Admission medications   Medication Sig Start Date End Date Taking? Authorizing Provider  albuterol (PROVENTIL HFA;VENTOLIN HFA) 108 (90 Base) MCG/ACT inhaler Inhale into the lungs.   Yes [provider]  atenolol (TENORMIN) 100 MG tablet Take 100 mg by mouth daily.   Yes [provider]  atorvastatin (LIPITOR) 10 MG tablet Take 10 mg by mouth daily.   Yes [provider]  buPROPion (WELLBUTRIN) 100 MG tablet Take 100 mg by mouth 2 (two) times daily.   Yes [provider]  desvenlafaxine (PRISTIQ) 100 MG 24 hr tablet Take 100 mg by mouth daily.   Yes [provider]  furosemide (LASIX) 20 MG tablet Take 20 mg by mouth daily.    Yes [provider]  haloperidol (HALDOL) 2 MG tablet Take 2 mg by mouth 2 (two) times daily.   Yes [provider]  lisdexamfetamine (VYVANSE) 40 MG capsule Take 60 mg by mouth every morning.    Yes [provider]  lisinopril (ZESTRIL) 20 MG tablet Take 20 mg by mouth daily.   Yes [provider]  quinapril (ACCUPRIL) 20 MG tablet Take 20 mg by mouth at bedtime.   Yes [provider]  traZODone (DESYREL) 150 MG tablet Take by mouth at bedtime.   Yes [provider]  acetaminophen (TYLENOL) 325 MG tablet Take by mouth.    [provider]  clobetasol ointment (TEMOVATE) 0.05 % Apply 1 application topically 2 (two) times daily.    [provider]  metFORMIN (GLUCOPHAGE) 500 MG tablet Take by mouth 2 (two) times daily with a meal.    [provider]  OZEMPIC, 1 MG/DOSE, 4 MG/3ML SOPN Inject into the skin.    [provider]  phytonadione (VITAMIN K) 5 MG tablet Take 1/2 tablet by mouth daily for 3 days. (Prescription has a refill in case course is extended after INR check Monday) 12/31/22     warfarin (COUMADIN) 5 MG tablet Take 5 mg by mouth daily. Patient not taking: Reported on 01/18/2023    [provider]  XARELTO 20 MG TABS tablet Take 20 mg by mouth daily with supper.  12/10/16   [provider]    Allergies as of 11/15/2022   (No Known Allergies)    Family History  Problem Relation Age of Onset   Diabetes Mother    Asthma Father    Breast cancer Paternal Aunt  Breast cancer Paternal Aunt    Kidney cancer Neg Hx    Kidney disease Neg Hx    Prostate cancer Neg Hx     Social History   Socioeconomic History   Marital status: Married    Spouse name: Not on file   Number of children: Not on file   Years of education: Not on file   Highest education level: Not on file  Occupational History   Not on file  Tobacco Use   Smoking status: Never   Smokeless tobacco: Never  Vaping Use   Vaping Use: Never used  Substance and Sexual Activity   Alcohol use: No   Drug use: No   Sexual activity: Not on file  Other Topics Concern   Not on file  Social History Narrative   Not on file   Social Determinants of Health   Financial Resource Strain: Not on file  Food Insecurity: Not on file   Transportation Needs: Not on file  Physical Activity: Not on file  Stress: Not on file  Social Connections: Not on file  Intimate Partner Violence: Not on file    Review of Systems: See HPI, otherwise negative ROS  Physical Exam: BP 129/79   Pulse 75   Temp (!) 97.4 F (36.3 C) (Temporal)   Resp 20   Ht 5\' 11"  (1.803 m)   Wt (!) 177.8 kg   SpO2 100%   BMI 54.67 kg/m  General:   Alert,  pleasant and cooperative in NAD Head:  Normocephalic and atraumatic. Neck:  Supple; no masses or thyromegaly. Lungs:  Clear throughout to auscultation, normal respiratory effort.    Heart:  +S1, +S2, Regular rate and rhythm, No edema. Abdomen:  Soft, nontender and nondistended. Normal bowel sounds, without guarding, and without rebound.   Neurologic:  Alert and  oriented x4;  grossly normal neurologically.  Impression/Plan: Eric Berg is here for an colonoscopy to be performed for surveillance due to prior history of colon polyps   Risks, benefits, limitations, and alternatives regarding  colonoscopy have been reviewed with the patient.  Questions have been answered.  All parties agreeable.   Wyline Mood, MD  01/18/2023, 10:11 AM

## 2023-01-18 NOTE — Anesthesia Procedure Notes (Signed)
Date/Time: 01/18/2023 10:38 AM  Performed by: Mohammed Kindle, CRNAPre-anesthesia Checklist: Patient identified, Emergency Drugs available, Suction available and Patient being monitored Patient Re-evaluated:Patient Re-evaluated prior to induction Oxygen Delivery Method: Simple face mask Induction Type: IV induction Placement Confirmation: positive ETCO2, breath sounds checked- equal and bilateral and CO2 detector Dental Injury: Teeth and Oropharynx as per pre-operative assessment

## 2023-01-18 NOTE — Anesthesia Preprocedure Evaluation (Signed)
Anesthesia Evaluation  Patient identified by MRN, date of birth, ID band Patient awake    Reviewed: Allergy & Precautions, H&P , NPO status , Patient's Chart, lab work & pertinent test results, reviewed documented beta blocker date and time   History of Anesthesia Complications Negative for: history of anesthetic complications  Airway Mallampati: II   Neck ROM: full    Dental  (+) Poor Dentition, Teeth Intact, Dental Advidsory Given   Pulmonary shortness of breath and with exertion, asthma , sleep apnea and Continuous Positive Airway Pressure Ventilation , neg COPD, neg recent URI   Pulmonary exam normal        Cardiovascular Exercise Tolerance: Poor hypertension, On Medications (-) angina (-) Past MI and (-) Cardiac Stents negative cardio ROS Normal cardiovascular exam(-) dysrhythmias (-) Valvular Problems/Murmurs Rhythm:regular Rate:Normal     Neuro/Psych  PSYCHIATRIC DISORDERS Anxiety Depression    negative neurological ROS     GI/Hepatic negative GI ROS, Neg liver ROS,,,  Endo/Other  diabetes, Poorly Controlled, Type 2, Oral Hypoglycemic Agents  Morbid obesity  Renal/GU negative Renal ROS  negative genitourinary   Musculoskeletal   Abdominal   Peds  Hematology negative hematology ROS (+)   Anesthesia Other Findings Past Medical History: No date: Anxiety No date: Asthma No date: Depression No date: Diabetes mellitus without complication (HCC) 04/2016: DVT (deep venous thrombosis) (HCC) No date: Gout No date: Hematuria No date: Hyperlipidemia No date: Hypertension 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 No date: Morbid (severe) obesity due to excess calories (HCC) No date: Sleep apnea No date: Stasis dermatitis Past Surgical History: No date: EYE SURGERY No date: lip cancer removed  BMI    Body Mass Index:  61.79 kg/m     Reproductive/Obstetrics negative OB ROS                              Anesthesia Physical Anesthesia Plan  ASA: 4  Anesthesia Plan: General   Post-op Pain Management:    Induction: Intravenous  PONV Risk Score and Plan: 2 and Propofol infusion and TIVA  Airway Management Planned: Natural Airway, Nasal Cannula and Nasal CPAP  Additional Equipment:   Intra-op Plan:   Post-operative Plan:   Informed Consent: I have reviewed the patients History and Physical, chart, labs and discussed the procedure including the risks, benefits and alternatives for the proposed anesthesia with the patient or authorized representative who has indicated his/her understanding and acceptance.     Dental Advisory Given  Plan Discussed with: CRNA  Anesthesia Plan Comments:         Anesthesia Quick Evaluation

## 2023-01-19 ENCOUNTER — Encounter: Payer: Self-pay | Admitting: Gastroenterology

## 2023-01-19 LAB — SURGICAL PATHOLOGY

## 2023-01-27 NOTE — Anesthesia Postprocedure Evaluation (Signed)
Anesthesia Post Note  Patient: Eric Berg  Procedure(s) Performed: COLONOSCOPY WITH PROPOFOL  Patient location during evaluation: Endoscopy Anesthesia Type: General Level of consciousness: awake and alert Pain management: pain level controlled Vital Signs Assessment: post-procedure vital signs reviewed and stable Respiratory status: spontaneous breathing, nonlabored ventilation, respiratory function stable and patient connected to nasal cannula oxygen Cardiovascular status: blood pressure returned to baseline and stable Postop Assessment: no apparent nausea or vomiting Anesthetic complications: no   No notable events documented.   Last Vitals:  Vitals:   01/18/23 1118 01/18/23 1136  BP:    Pulse:    Resp:    Temp: (!) 36.2 C   SpO2:  94%    Last Pain:  Vitals:   01/19/23 0739  TempSrc:   PainSc: 0-No pain                 Lenard Simmer
